# Patient Record
Sex: Female | Born: 1945 | Race: White | Hispanic: No | Marital: Married | Smoking: Never smoker
Health system: Southern US, Community
[De-identification: ages and names within clinical notes are randomized; demographics above are authoritative.]

## PROBLEM LIST (undated history)

## (undated) DIAGNOSIS — K219 Gastro-esophageal reflux disease without esophagitis: Secondary | ICD-10-CM

## (undated) DIAGNOSIS — Z9889 Other specified postprocedural states: Secondary | ICD-10-CM

## (undated) DIAGNOSIS — E119 Type 2 diabetes mellitus without complications: Secondary | ICD-10-CM

## (undated) DIAGNOSIS — E079 Disorder of thyroid, unspecified: Secondary | ICD-10-CM

## (undated) DIAGNOSIS — R112 Nausea with vomiting, unspecified: Secondary | ICD-10-CM

## (undated) DIAGNOSIS — E039 Hypothyroidism, unspecified: Secondary | ICD-10-CM

## (undated) DIAGNOSIS — K589 Irritable bowel syndrome without diarrhea: Secondary | ICD-10-CM

## (undated) HISTORY — DX: Gastro-esophageal reflux disease without esophagitis: K21.9

## (undated) HISTORY — PX: CHOLECYSTECTOMY: SHX55

## (undated) HISTORY — DX: Irritable bowel syndrome, unspecified: K58.9

## (undated) HISTORY — PX: MENISCUS REPAIR: SHX5179

## (undated) HISTORY — DX: Disorder of thyroid, unspecified: E07.9

## (undated) HISTORY — DX: Type 2 diabetes mellitus without complications: E11.9

---

## 2002-12-15 HISTORY — PX: BREAST BIOPSY: SHX20

## 2004-11-25 ENCOUNTER — Ambulatory Visit: Payer: Self-pay | Admitting: Family Medicine

## 2006-08-03 ENCOUNTER — Ambulatory Visit: Payer: Self-pay | Admitting: Family Medicine

## 2007-11-09 ENCOUNTER — Ambulatory Visit: Payer: Self-pay | Admitting: Family Medicine

## 2007-11-23 ENCOUNTER — Ambulatory Visit: Payer: Self-pay | Admitting: Family Medicine

## 2008-11-08 ENCOUNTER — Ambulatory Visit: Payer: Self-pay | Admitting: Internal Medicine

## 2009-01-01 ENCOUNTER — Ambulatory Visit: Payer: Self-pay | Admitting: Specialist

## 2009-01-11 ENCOUNTER — Ambulatory Visit: Payer: Self-pay | Admitting: Specialist

## 2009-05-29 ENCOUNTER — Ambulatory Visit: Payer: Self-pay | Admitting: Family Medicine

## 2009-05-30 ENCOUNTER — Ambulatory Visit: Payer: Self-pay | Admitting: Family Medicine

## 2010-09-02 ENCOUNTER — Ambulatory Visit: Payer: Self-pay | Admitting: Family Medicine

## 2010-09-05 ENCOUNTER — Ambulatory Visit: Payer: Self-pay | Admitting: Family Medicine

## 2011-03-14 ENCOUNTER — Ambulatory Visit: Payer: Self-pay | Admitting: Specialist

## 2011-08-05 ENCOUNTER — Ambulatory Visit: Payer: Self-pay | Admitting: Specialist

## 2011-08-29 ENCOUNTER — Ambulatory Visit: Payer: Self-pay | Admitting: Specialist

## 2011-12-19 DIAGNOSIS — Z23 Encounter for immunization: Secondary | ICD-10-CM | POA: Diagnosis not present

## 2012-10-11 DIAGNOSIS — H8309 Labyrinthitis, unspecified ear: Secondary | ICD-10-CM | POA: Diagnosis not present

## 2012-10-11 DIAGNOSIS — J329 Chronic sinusitis, unspecified: Secondary | ICD-10-CM | POA: Diagnosis not present

## 2012-10-27 DIAGNOSIS — Z0000 Encounter for general adult medical examination without abnormal findings: Secondary | ICD-10-CM | POA: Diagnosis not present

## 2012-10-27 DIAGNOSIS — Z01419 Encounter for gynecological examination (general) (routine) without abnormal findings: Secondary | ICD-10-CM | POA: Diagnosis not present

## 2012-10-27 DIAGNOSIS — H8309 Labyrinthitis, unspecified ear: Secondary | ICD-10-CM | POA: Diagnosis not present

## 2012-10-27 DIAGNOSIS — J329 Chronic sinusitis, unspecified: Secondary | ICD-10-CM | POA: Diagnosis not present

## 2012-11-05 ENCOUNTER — Ambulatory Visit: Payer: Self-pay | Admitting: Family Medicine

## 2012-11-05 DIAGNOSIS — Z1231 Encounter for screening mammogram for malignant neoplasm of breast: Secondary | ICD-10-CM | POA: Diagnosis not present

## 2013-01-10 DIAGNOSIS — Z23 Encounter for immunization: Secondary | ICD-10-CM | POA: Diagnosis not present

## 2013-12-15 HISTORY — PX: COLONOSCOPY: SHX174

## 2013-12-23 DIAGNOSIS — Z23 Encounter for immunization: Secondary | ICD-10-CM | POA: Diagnosis not present

## 2014-02-14 DIAGNOSIS — H9319 Tinnitus, unspecified ear: Secondary | ICD-10-CM | POA: Diagnosis not present

## 2014-02-14 DIAGNOSIS — H6120 Impacted cerumen, unspecified ear: Secondary | ICD-10-CM | POA: Diagnosis not present

## 2014-02-17 DIAGNOSIS — H903 Sensorineural hearing loss, bilateral: Secondary | ICD-10-CM | POA: Diagnosis not present

## 2014-02-17 DIAGNOSIS — R42 Dizziness and giddiness: Secondary | ICD-10-CM | POA: Diagnosis not present

## 2014-02-24 DIAGNOSIS — H903 Sensorineural hearing loss, bilateral: Secondary | ICD-10-CM | POA: Diagnosis not present

## 2014-02-24 DIAGNOSIS — R42 Dizziness and giddiness: Secondary | ICD-10-CM | POA: Diagnosis not present

## 2014-03-15 DIAGNOSIS — D1039 Benign neoplasm of other parts of mouth: Secondary | ICD-10-CM | POA: Diagnosis not present

## 2014-03-31 DIAGNOSIS — R634 Abnormal weight loss: Secondary | ICD-10-CM | POA: Diagnosis not present

## 2014-03-31 DIAGNOSIS — E119 Type 2 diabetes mellitus without complications: Secondary | ICD-10-CM | POA: Diagnosis not present

## 2014-03-31 DIAGNOSIS — R109 Unspecified abdominal pain: Secondary | ICD-10-CM | POA: Diagnosis not present

## 2014-04-04 DIAGNOSIS — E039 Hypothyroidism, unspecified: Secondary | ICD-10-CM | POA: Diagnosis not present

## 2014-04-04 DIAGNOSIS — E785 Hyperlipidemia, unspecified: Secondary | ICD-10-CM | POA: Diagnosis not present

## 2014-04-04 DIAGNOSIS — E119 Type 2 diabetes mellitus without complications: Secondary | ICD-10-CM | POA: Diagnosis not present

## 2014-04-10 DIAGNOSIS — Z8371 Family history of colonic polyps: Secondary | ICD-10-CM | POA: Diagnosis not present

## 2014-04-10 DIAGNOSIS — IMO0001 Reserved for inherently not codable concepts without codable children: Secondary | ICD-10-CM | POA: Diagnosis not present

## 2014-04-20 DIAGNOSIS — E119 Type 2 diabetes mellitus without complications: Secondary | ICD-10-CM | POA: Diagnosis not present

## 2014-04-25 ENCOUNTER — Ambulatory Visit: Payer: Self-pay | Admitting: Gastroenterology

## 2014-04-25 DIAGNOSIS — R5382 Chronic fatigue, unspecified: Secondary | ICD-10-CM | POA: Diagnosis not present

## 2014-04-25 DIAGNOSIS — G9332 Myalgic encephalomyelitis/chronic fatigue syndrome: Secondary | ICD-10-CM | POA: Diagnosis not present

## 2014-04-25 DIAGNOSIS — E119 Type 2 diabetes mellitus without complications: Secondary | ICD-10-CM | POA: Diagnosis not present

## 2014-04-25 DIAGNOSIS — Z833 Family history of diabetes mellitus: Secondary | ICD-10-CM | POA: Diagnosis not present

## 2014-04-25 DIAGNOSIS — Z79899 Other long term (current) drug therapy: Secondary | ICD-10-CM | POA: Diagnosis not present

## 2014-04-25 DIAGNOSIS — K589 Irritable bowel syndrome without diarrhea: Secondary | ICD-10-CM | POA: Diagnosis not present

## 2014-04-25 DIAGNOSIS — Z83719 Family history of colon polyps, unspecified: Secondary | ICD-10-CM | POA: Diagnosis not present

## 2014-04-25 DIAGNOSIS — Z801 Family history of malignant neoplasm of trachea, bronchus and lung: Secondary | ICD-10-CM | POA: Diagnosis not present

## 2014-04-25 DIAGNOSIS — Z8371 Family history of colonic polyps: Secondary | ICD-10-CM | POA: Diagnosis not present

## 2014-04-25 DIAGNOSIS — Z1211 Encounter for screening for malignant neoplasm of colon: Secondary | ICD-10-CM | POA: Diagnosis not present

## 2014-04-25 DIAGNOSIS — Z880 Allergy status to penicillin: Secondary | ICD-10-CM | POA: Diagnosis not present

## 2014-04-25 DIAGNOSIS — E785 Hyperlipidemia, unspecified: Secondary | ICD-10-CM | POA: Diagnosis not present

## 2014-04-25 DIAGNOSIS — R51 Headache: Secondary | ICD-10-CM | POA: Diagnosis not present

## 2014-04-25 LAB — HM COLONOSCOPY

## 2014-05-15 DIAGNOSIS — E039 Hypothyroidism, unspecified: Secondary | ICD-10-CM | POA: Diagnosis not present

## 2014-05-15 DIAGNOSIS — K589 Irritable bowel syndrome without diarrhea: Secondary | ICD-10-CM | POA: Diagnosis not present

## 2014-05-15 DIAGNOSIS — E119 Type 2 diabetes mellitus without complications: Secondary | ICD-10-CM | POA: Diagnosis not present

## 2014-09-05 DIAGNOSIS — H9319 Tinnitus, unspecified ear: Secondary | ICD-10-CM | POA: Diagnosis not present

## 2014-09-05 DIAGNOSIS — H6120 Impacted cerumen, unspecified ear: Secondary | ICD-10-CM | POA: Diagnosis not present

## 2014-09-08 DIAGNOSIS — N3090 Cystitis, unspecified without hematuria: Secondary | ICD-10-CM | POA: Diagnosis not present

## 2014-11-20 DIAGNOSIS — R51 Headache: Secondary | ICD-10-CM | POA: Diagnosis not present

## 2014-11-20 DIAGNOSIS — Z0000 Encounter for general adult medical examination without abnormal findings: Secondary | ICD-10-CM | POA: Diagnosis not present

## 2014-11-20 DIAGNOSIS — Z1389 Encounter for screening for other disorder: Secondary | ICD-10-CM | POA: Diagnosis not present

## 2014-11-20 DIAGNOSIS — E039 Hypothyroidism, unspecified: Secondary | ICD-10-CM | POA: Diagnosis not present

## 2014-11-20 DIAGNOSIS — E784 Other hyperlipidemia: Secondary | ICD-10-CM | POA: Diagnosis not present

## 2014-11-20 DIAGNOSIS — E119 Type 2 diabetes mellitus without complications: Secondary | ICD-10-CM | POA: Diagnosis not present

## 2014-11-20 DIAGNOSIS — Z23 Encounter for immunization: Secondary | ICD-10-CM | POA: Diagnosis not present

## 2014-11-23 DIAGNOSIS — R519 Headache, unspecified: Secondary | ICD-10-CM | POA: Insufficient documentation

## 2014-11-23 DIAGNOSIS — R42 Dizziness and giddiness: Secondary | ICD-10-CM | POA: Diagnosis not present

## 2014-11-23 DIAGNOSIS — R51 Headache: Secondary | ICD-10-CM | POA: Diagnosis not present

## 2014-11-23 DIAGNOSIS — E860 Dehydration: Secondary | ICD-10-CM | POA: Diagnosis not present

## 2014-11-29 DIAGNOSIS — L309 Dermatitis, unspecified: Secondary | ICD-10-CM | POA: Diagnosis not present

## 2015-01-02 ENCOUNTER — Ambulatory Visit: Payer: Self-pay | Admitting: Family Medicine

## 2015-01-02 DIAGNOSIS — Z780 Asymptomatic menopausal state: Secondary | ICD-10-CM | POA: Diagnosis not present

## 2015-01-02 DIAGNOSIS — Z1231 Encounter for screening mammogram for malignant neoplasm of breast: Secondary | ICD-10-CM | POA: Diagnosis not present

## 2015-01-02 DIAGNOSIS — Z13820 Encounter for screening for osteoporosis: Secondary | ICD-10-CM | POA: Diagnosis not present

## 2015-01-12 ENCOUNTER — Ambulatory Visit: Payer: Self-pay | Admitting: Family Medicine

## 2015-01-12 DIAGNOSIS — R928 Other abnormal and inconclusive findings on diagnostic imaging of breast: Secondary | ICD-10-CM | POA: Diagnosis not present

## 2015-01-12 DIAGNOSIS — R921 Mammographic calcification found on diagnostic imaging of breast: Secondary | ICD-10-CM | POA: Diagnosis not present

## 2015-05-07 DIAGNOSIS — E039 Hypothyroidism, unspecified: Secondary | ICD-10-CM | POA: Diagnosis not present

## 2015-05-07 DIAGNOSIS — E784 Other hyperlipidemia: Secondary | ICD-10-CM | POA: Diagnosis not present

## 2015-05-07 DIAGNOSIS — E119 Type 2 diabetes mellitus without complications: Secondary | ICD-10-CM | POA: Diagnosis not present

## 2015-08-21 ENCOUNTER — Other Ambulatory Visit: Payer: Self-pay

## 2015-08-21 DIAGNOSIS — R928 Other abnormal and inconclusive findings on diagnostic imaging of breast: Secondary | ICD-10-CM

## 2015-08-22 ENCOUNTER — Other Ambulatory Visit: Payer: Self-pay

## 2015-08-22 DIAGNOSIS — N6459 Other signs and symptoms in breast: Secondary | ICD-10-CM

## 2015-09-03 ENCOUNTER — Ambulatory Visit
Admission: RE | Admit: 2015-09-03 | Discharge: 2015-09-03 | Disposition: A | Discharge status: Home / Self Care | Payer: Medicare Other | Source: Ambulatory Visit | Attending: Family Medicine | Admitting: Family Medicine

## 2015-09-03 ENCOUNTER — Other Ambulatory Visit: Payer: Self-pay | Admitting: Family Medicine

## 2015-09-03 DIAGNOSIS — R921 Mammographic calcification found on diagnostic imaging of breast: Secondary | ICD-10-CM | POA: Insufficient documentation

## 2015-09-03 DIAGNOSIS — N6459 Other signs and symptoms in breast: Secondary | ICD-10-CM

## 2015-09-03 DIAGNOSIS — R928 Other abnormal and inconclusive findings on diagnostic imaging of breast: Secondary | ICD-10-CM

## 2015-09-24 DIAGNOSIS — J301 Allergic rhinitis due to pollen: Secondary | ICD-10-CM | POA: Diagnosis not present

## 2015-09-24 DIAGNOSIS — H6122 Impacted cerumen, left ear: Secondary | ICD-10-CM | POA: Diagnosis not present

## 2015-12-06 DIAGNOSIS — Z23 Encounter for immunization: Secondary | ICD-10-CM | POA: Diagnosis not present

## 2015-12-21 ENCOUNTER — Other Ambulatory Visit: Payer: Self-pay

## 2015-12-21 DIAGNOSIS — E119 Type 2 diabetes mellitus without complications: Secondary | ICD-10-CM

## 2015-12-21 DIAGNOSIS — E039 Hypothyroidism, unspecified: Secondary | ICD-10-CM

## 2015-12-21 MED ORDER — LEVOTHYROXINE SODIUM 25 MCG PO TABS: 25.0000 ug | ORAL_TABLET | Freq: Every day | ORAL | Status: DC

## 2015-12-21 MED ORDER — METFORMIN HCL 500 MG PO TABS: 500.0000 mg | ORAL_TABLET | Freq: Two times a day (BID) | ORAL | Status: DC

## 2016-01-15 ENCOUNTER — Ambulatory Visit (INDEPENDENT_AMBULATORY_CARE_PROVIDER_SITE_OTHER): Payer: Medicare Other | Admitting: Family Medicine

## 2016-01-15 ENCOUNTER — Encounter: Payer: Self-pay | Admitting: Family Medicine

## 2016-01-15 VITALS — BP 122/82 | HR 80 | Ht 66.0 in | Wt 157.0 lb

## 2016-01-15 DIAGNOSIS — E039 Hypothyroidism, unspecified: Secondary | ICD-10-CM | POA: Diagnosis not present

## 2016-01-15 DIAGNOSIS — R928 Other abnormal and inconclusive findings on diagnostic imaging of breast: Secondary | ICD-10-CM

## 2016-01-15 DIAGNOSIS — Z23 Encounter for immunization: Secondary | ICD-10-CM | POA: Diagnosis not present

## 2016-01-15 DIAGNOSIS — E119 Type 2 diabetes mellitus without complications: Secondary | ICD-10-CM | POA: Diagnosis not present

## 2016-01-15 DIAGNOSIS — K219 Gastro-esophageal reflux disease without esophagitis: Secondary | ICD-10-CM | POA: Diagnosis not present

## 2016-01-15 DIAGNOSIS — K589 Irritable bowel syndrome without diarrhea: Secondary | ICD-10-CM

## 2016-01-15 MED ORDER — CHOLESTYRAMINE LIGHT 4 GM/DOSE PO POWD: 4.0000 g | Freq: Every day | ORAL | Status: DC

## 2016-01-15 MED ORDER — METFORMIN HCL 500 MG PO TABS: 500.0000 mg | ORAL_TABLET | Freq: Every day | ORAL | Status: DC

## 2016-01-15 MED ORDER — LEVOTHYROXINE SODIUM 25 MCG PO TABS: 25.0000 ug | ORAL_TABLET | Freq: Every day | ORAL | Status: DC

## 2016-01-15 NOTE — Progress Notes (Signed)
Name: Rebecca Robbins   MRN: [id number]    DOB: 07-Mar-1946   Date:01/15/2016       Progress Note  Subjective  Chief Complaint  Chief Complaint  Patient presents with  . Gastroesophageal Reflux  . Diabetes  . Irritable Bowel Syndrome    needs cholestyramine refilled  . Immunizations    needs Prevnar 13  . abnormal mammogram    follow up on R) breast- bilateral diagnostic with add views on R) breast    Gastroesophageal Reflux She reports no abdominal pain, no belching, no chest pain, no choking, no coughing, no dysphagia, no early satiety, no globus sensation, no heartburn, no hoarse voice, no nausea, no sore throat, no stridor, no tooth decay, no water brash or no wheezing. The current episode started more than 1 year ago. The problem occurs occasionally. The problem has been gradually improving. The symptoms are aggravated by certain foods. Pertinent negatives include no anemia, fatigue, melena, muscle weakness, orthopnea or weight loss. There are no known risk factors. She has tried a PPI for the symptoms. The treatment provided moderate relief.  Diabetes She presents for her follow-up diabetic visit. She has type 2 diabetes mellitus. Pertinent negatives for hypoglycemia include no dizziness, headaches, nervousness/anxiousness or tremors. Pertinent negatives for diabetes include no blurred vision, no chest pain, no fatigue, no foot paresthesias, no foot ulcerations, no polydipsia, no polyphagia, no polyuria, no visual change, no weakness and no weight loss. (Diarrhea) Risk factors for coronary artery disease include diabetes mellitus and dyslipidemia. Current diabetic treatment includes oral agent (monotherapy). She participates in exercise intermittently. Her breakfast blood glucose is taken between 8-9 am. Her breakfast blood glucose range is generally 110-130 mg/dl. An ACE inhibitor/angiotensin II receptor blocker is being taken. She does not see a podiatrist.Eye exam is not current.   Thyroid Problem Presents for follow-up visit. Patient reports no anxiety, cold intolerance, constipation, depressed mood, diaphoresis, diarrhea, dry skin, fatigue, hair loss, heat intolerance, hoarse voice, leg swelling, menstrual problem, nail problem, palpitations, tremors, visual change, weight gain or weight loss. The symptoms have been stable. Past treatments include levothyroxine. The treatment provided moderate relief. There are no known risk factors.  Abdominal Cramping This is a chronic problem. The current episode started more than 1 year ago. The problem has been gradually improving. The pain is located in the generalized abdominal region. The pain is mild. The quality of the pain is colicky. Pertinent negatives include no belching, constipation, diarrhea, dysuria, fever, frequency, headaches, hematuria, melena, myalgias, nausea or weight loss. Her past medical history is significant for GERD.    No problem-specific assessment & plan notes found for this encounter.   Past Medical History  Diagnosis Date  . Thyroid disease   . GERD (gastroesophageal reflux disease)   . Diabetes mellitus without complication ([place])   . Irritable bowel syndrome     Past Surgical History  Procedure Laterality Date  . Breast biopsy Right 2004    neg  . Cholecystectomy    . Colonoscopy  2015    repeat 10 yrs- Dr Allen Norris    Family History  Problem Relation Age of Onset  . Cancer Mother   . Diabetes Mother   . Cancer Father   . Heart disease Maternal Grandmother   . Heart disease Paternal Grandmother     Social History   Social History  . Marital Status: Married    Spouse Name: N/A  . Number of Children: N/A  . Years of Education: N/A  Occupational History  . Not on file.   Social History Main Topics  . Smoking status: Never Smoker   . Smokeless tobacco: Not on file  . Alcohol Use: 0.0 oz/week    0 Standard drinks or equivalent per week  . Drug Use: No  . Sexual Activity: Yes    Other Topics Concern  . Not on file   Social History Narrative    Allergies  Allergen Reactions  . Penicillins Other (See Comments)    Does not work for patient  . Hydrocodone-Acetaminophen Nausea Only and Rash     Review of Systems  Constitutional: Negative for fever, chills, weight loss, weight gain, malaise/fatigue, diaphoresis and fatigue.  HENT: Negative for ear discharge, ear pain, hoarse voice and sore throat.   Eyes: Negative for blurred vision.  Respiratory: Negative for cough, sputum production, choking, shortness of breath and wheezing.   Cardiovascular: Negative for chest pain, palpitations and leg swelling.  Gastrointestinal: Negative for heartburn, dysphagia, nausea, abdominal pain, diarrhea, constipation, blood in stool and melena.  Genitourinary: Negative for dysuria, urgency, frequency, hematuria and menstrual problem.  Musculoskeletal: Negative for myalgias, back pain, joint pain, muscle weakness and neck pain.  Skin: Negative for rash.  Neurological: Negative for dizziness, tingling, tremors, sensory change, focal weakness, weakness and headaches.  Endo/Heme/Allergies: Negative for environmental allergies, cold intolerance, heat intolerance, polydipsia and polyphagia. Does not bruise/bleed easily.  Psychiatric/Behavioral: Negative for depression and suicidal ideas. The patient is not nervous/anxious and does not have insomnia.      Objective  Filed Vitals:   01/15/16 0915  BP: 122/82  Pulse: 80  Height: 5\' 6"  (1.676 m)  Weight: 157 lb (71.215 kg)    Physical Exam  Constitutional: She is well-developed, well-nourished, and in no distress. No distress.  HENT:  Head: Normocephalic and atraumatic.  Right Ear: External ear normal.  Left Ear: External ear normal.  Nose: Nose normal.  Mouth/Throat: Oropharynx is clear and moist.  Eyes: Conjunctivae and EOM are normal. Pupils are equal, round, and reactive to light. Right eye exhibits no discharge. Left  eye exhibits no discharge.  Neck: Normal range of motion. Neck supple. No JVD present. No thyromegaly present.  Cardiovascular: Normal rate, regular rhythm, normal heart sounds and intact distal pulses.  Exam reveals no gallop and no friction rub.   No murmur heard. Pulmonary/Chest: Effort normal and breath sounds normal.  Abdominal: Soft. Bowel sounds are normal. She exhibits no mass. There is no tenderness. There is no guarding.  Musculoskeletal: Normal range of motion. She exhibits no edema.  Lymphadenopathy:    She has no cervical adenopathy.  Neurological: She is alert. She has normal reflexes.  Skin: Skin is warm and dry. She is not diaphoretic.  Psychiatric: Mood and affect normal.  Nursing note and vitals reviewed.     Assessment & Plan  Problem List Items Addressed This Visit    None    Visit Diagnoses    Type 2 diabetes mellitus without complication, without long-term current use of insulin (HCC)    -  Primary    IBS (irritable bowel syndrome)        Relevant Medications    omeprazole (PRILOSEC) 20 MG capsule    cholestyramine light (PREVALITE) 4 GM/DOSE powder    Hypothyroidism, unspecified hypothyroidism type        Relevant Medications    levothyroxine (SYNTHROID, LEVOTHROID) 25 MCG tablet    Abnormal mammogram        Relevant Orders  MM Digital Diagnostic Bilat    US BREAST LTD UNI RIGHT INC AXILLA    Gastroesophageal reflux disease, esophagitis presence not specified        Relevant Medications    omeprazole (PRILOSEC) 20 MG capsule    Need for pneumococcal vaccination        Relevant Orders    Pneumococcal conjugate vaccine 13-valent (Completed)         Dr. [facility]  01/15/2016

## 2016-01-15 NOTE — Addendum Note (Signed)
Addended by: Fredderick Severance on: 01/15/2016 01:46 PM   Modules accepted: Orders

## 2016-01-29 ENCOUNTER — Other Ambulatory Visit: Payer: Self-pay | Admitting: Family Medicine

## 2016-01-29 ENCOUNTER — Ambulatory Visit
Admission: RE | Admit: 2016-01-29 | Discharge: 2016-01-29 | Disposition: A | Discharge status: Home / Self Care | Payer: Medicare Other | Source: Ambulatory Visit | Attending: Family Medicine | Admitting: Family Medicine

## 2016-01-29 DIAGNOSIS — R921 Mammographic calcification found on diagnostic imaging of breast: Secondary | ICD-10-CM | POA: Diagnosis not present

## 2016-01-29 DIAGNOSIS — R928 Other abnormal and inconclusive findings on diagnostic imaging of breast: Secondary | ICD-10-CM | POA: Diagnosis not present

## 2016-02-26 ENCOUNTER — Other Ambulatory Visit: Payer: Self-pay | Admitting: Family Medicine

## 2016-03-21 ENCOUNTER — Other Ambulatory Visit: Payer: Medicare Other

## 2016-03-21 ENCOUNTER — Other Ambulatory Visit: Payer: Self-pay

## 2016-03-21 DIAGNOSIS — E039 Hypothyroidism, unspecified: Secondary | ICD-10-CM | POA: Diagnosis not present

## 2016-03-21 DIAGNOSIS — E119 Type 2 diabetes mellitus without complications: Secondary | ICD-10-CM | POA: Diagnosis not present

## 2016-03-21 DIAGNOSIS — I10 Essential (primary) hypertension: Secondary | ICD-10-CM

## 2016-03-22 LAB — RENAL FUNCTION PANEL
Albumin: 5 g/dL — ABNORMAL HIGH (ref 3.5–4.8)
BUN/Creatinine Ratio: 18 (ref 12–28)
BUN: 13 mg/dL (ref 8–27)
CALCIUM: 10.1 mg/dL (ref 8.7–10.3)
CO2: 23 mmol/L (ref 18–29)
CREATININE: 0.71 mg/dL (ref 0.57–1.00)
Chloride: 99 mmol/L (ref 96–106)
GFR calc Af Amer: 100 mL/min/{1.73_m2} (ref >59)
GFR, EST NON AFRICAN AMERICAN: 87 mL/min/{1.73_m2} (ref >59)
Glucose: 127 mg/dL — ABNORMAL HIGH (ref 65–99)
PHOSPHORUS: 3.9 mg/dL (ref 2.5–4.5)
Potassium: 4.9 mmol/L (ref 3.5–5.2)
SODIUM: 142 mmol/L (ref 134–144)

## 2016-03-22 LAB — LIPID PANEL
CHOL/HDL RATIO: 3.2 ratio (ref 0.0–4.4)
CHOLESTEROL TOTAL: 230 mg/dL — AB (ref 100–199)
HDL: 71 mg/dL (ref >39)
LDL Calculated: 134 mg/dL — ABNORMAL HIGH (ref 0–99)
TRIGLYCERIDES: 124 mg/dL (ref 0–149)
VLDL CHOLESTEROL CAL: 25 mg/dL (ref 5–40)

## 2016-03-22 LAB — HEMOGLOBIN A1C
Est. average glucose Bld gHb Est-mCnc: 151 mg/dL
HEMOGLOBIN A1C: 6.9 % — AB (ref 4.8–5.6)

## 2016-03-22 LAB — TSH: TSH: 3.97 u[IU]/mL (ref 0.450–4.500)

## 2016-03-25 ENCOUNTER — Other Ambulatory Visit: Payer: Self-pay

## 2016-03-25 MED ORDER — METFORMIN HCL 500 MG PO TABS: 500.0000 mg | ORAL_TABLET | Freq: Every day | ORAL | Status: DC

## 2016-05-23 ENCOUNTER — Ambulatory Visit (INDEPENDENT_AMBULATORY_CARE_PROVIDER_SITE_OTHER): Payer: Medicare Other | Admitting: Family Medicine

## 2016-05-23 ENCOUNTER — Encounter: Payer: Self-pay | Admitting: Family Medicine

## 2016-05-23 VITALS — BP 120/80 | HR 80 | Ht 66.0 in | Wt 153.0 lb

## 2016-05-23 DIAGNOSIS — J0110 Acute frontal sinusitis, unspecified: Secondary | ICD-10-CM | POA: Diagnosis not present

## 2016-05-23 DIAGNOSIS — J40 Bronchitis, not specified as acute or chronic: Secondary | ICD-10-CM

## 2016-05-23 DIAGNOSIS — N3090 Cystitis, unspecified without hematuria: Secondary | ICD-10-CM

## 2016-05-23 LAB — POCT URINALYSIS DIPSTICK
Bilirubin, UA: NEGATIVE
Blood, UA: NEGATIVE
GLUCOSE UA: NEGATIVE
Ketones, UA: NEGATIVE
LEUKOCYTES UA: NEGATIVE
Nitrite, UA: NEGATIVE
PROTEIN UA: NEGATIVE
Spec Grav, UA: 1.01
UROBILINOGEN UA: 0.2
pH, UA: 5

## 2016-05-23 MED ORDER — MONTELUKAST SODIUM 10 MG PO TABS: 10.0000 mg | ORAL_TABLET | Freq: Every day | ORAL | Status: DC

## 2016-05-23 MED ORDER — SULFAMETHOXAZOLE-TRIMETHOPRIM 800-160 MG PO TABS: 1.0000 | ORAL_TABLET | Freq: Two times a day (BID) | ORAL | Status: DC

## 2016-05-23 NOTE — Progress Notes (Addendum)
Name: Rebecca Robbins   MRN: [id number]    DOB: [birth year]   Date:05/23/2016       Progress Note  Subjective  Chief Complaint  Chief Complaint  Patient presents with  . Sinusitis    x 3 weeks- had a ZPack and "got better", but still congested and has a headache  . Urinary Tract Infection    frequent urination and burning    Sinusitis This is a new problem. The current episode started 1 to 4 weeks ago. The problem has been waxing and waning since onset. The maximum temperature recorded prior to her arrival was 100.4 - 100.9 F. The fever has been present for 1 to 2 days. Associated symptoms include chills, congestion, coughing, headaches, shortness of breath, sinus pressure, sneezing, a sore throat and swollen glands. Pertinent negatives include no diaphoresis, ear pain, hoarse voice or neck pain. Past treatments include acetaminophen. The treatment provided no relief.  Urinary Tract Infection  Associated symptoms include chills. Pertinent negatives include no frequency, hematuria, nausea or urgency.    No problem-specific assessment & plan notes found for this encounter.   Past Medical History  Diagnosis Date  . Thyroid disease   . GERD (gastroesophageal reflux disease)   . Diabetes mellitus without complication ([place])   . Irritable bowel syndrome     Past Surgical History  Procedure Laterality Date  . Cholecystectomy    . Colonoscopy  2015    repeat 10 yrs- Dr Allen Norris  . Breast biopsy Right 2004    neg    Family History  Problem Relation Age of Onset  . Cancer Mother   . Diabetes Mother   . Cancer Father   . Heart disease Maternal Grandmother   . Heart disease Paternal Grandmother   . Breast cancer Neg Hx     Social History   Social History  . Marital Status: Married    Spouse Name: N/A  . Number of Children: N/A  . Years of Education: N/A   Occupational History  . Not on file.   Social History Main Topics  . Smoking status: Never Smoker   . Smokeless  tobacco: Not on file  . Alcohol Use: 0.0 oz/week    0 Standard drinks or equivalent per week  . Drug Use: No  . Sexual Activity: Yes   Other Topics Concern  . Not on file   Social History Narrative    Allergies  Allergen Reactions  . Penicillins Other (See Comments)    Does not work for patient  . Hydrocodone-Acetaminophen Nausea Only and Rash     Review of Systems  Constitutional: Positive for chills. Negative for fever, weight loss, malaise/fatigue and diaphoresis.  HENT: Positive for congestion, sinus pressure, sneezing and sore throat. Negative for ear discharge, ear pain and hoarse voice.   Eyes: Negative for blurred vision.  Respiratory: Positive for cough and shortness of breath. Negative for sputum production and wheezing.   Cardiovascular: Negative for chest pain, palpitations and leg swelling.  Gastrointestinal: Negative for heartburn, nausea, abdominal pain, diarrhea, constipation, blood in stool and melena.  Genitourinary: Negative for dysuria, urgency, frequency and hematuria.  Musculoskeletal: Negative for myalgias, back pain, joint pain and neck pain.  Skin: Negative for rash.  Neurological: Positive for headaches. Negative for dizziness, tingling, sensory change and focal weakness.  Endo/Heme/Allergies: Negative for environmental allergies and polydipsia. Does not bruise/bleed easily.  Psychiatric/Behavioral: Negative for depression and suicidal ideas. The patient is not nervous/anxious and does not have insomnia.  Objective  Filed Vitals:   05/23/16 1404  BP: 120/80  Pulse: 80  Height: 5\' 6"  (1.676 m)  Weight: 153 lb (69.4 kg)    Physical Exam  Constitutional: She is well-developed, well-nourished, and in no distress. No distress.  HENT:  Head: Normocephalic and atraumatic.  Right Ear: Tympanic membrane, external ear and ear canal normal.  Left Ear: Tympanic membrane, external ear and ear canal normal.  Nose: Nose normal.  Mouth/Throat:  Oropharynx is clear and moist.  Eyes: Conjunctivae and EOM are normal. Pupils are equal, round, and reactive to light. Right eye exhibits no discharge. Left eye exhibits no discharge.  Neck: Normal range of motion. Neck supple. No JVD present. No thyromegaly present.  Cardiovascular: Normal rate, regular rhythm, normal heart sounds and intact distal pulses.  Exam reveals no gallop and no friction rub.   No murmur heard. Pulmonary/Chest: Effort normal and breath sounds normal.  Abdominal: Soft. Bowel sounds are normal. She exhibits no mass. There is no tenderness. There is no guarding.  Musculoskeletal: Normal range of motion. She exhibits no edema.  Lymphadenopathy:    She has no cervical adenopathy.  Neurological: She is alert.  Skin: Skin is warm and dry. She is not diaphoretic.  Psychiatric: Mood and affect normal.  Nursing note and vitals reviewed.     Assessment & Plan  Problem List Items Addressed This Visit    None    Visit Diagnoses    Acute frontal sinusitis, recurrence not specified    -  Primary    Relevant Medications    montelukast (SINGULAIR) 10 MG tablet    sulfamethoxazole-trimethoprim (BACTRIM DS,SEPTRA DS) 800-160 MG tablet    Bronchitis        Relevant Medications    montelukast (SINGULAIR) 10 MG tablet    sulfamethoxazole-trimethoprim (BACTRIM DS,SEPTRA DS) 800-160 MG tablet         Dr. [facility]  05/23/2016

## 2016-05-23 NOTE — Addendum Note (Signed)
Addended by: Juline Patch on: 05/23/2016 02:25 PM   Modules accepted: Miquel Dunn

## 2016-05-23 NOTE — Addendum Note (Signed)
Addended by: Fredderick Severance on: 05/23/2016 03:10 PM   Modules accepted: Orders

## 2016-07-11 ENCOUNTER — Other Ambulatory Visit: Payer: Self-pay

## 2016-07-11 DIAGNOSIS — K589 Irritable bowel syndrome without diarrhea: Secondary | ICD-10-CM

## 2016-07-11 MED ORDER — CHOLESTYRAMINE LIGHT 4 GM/DOSE PO POWD: 4.0000 g | Freq: Every day | ORAL | 0 refills | Status: DC

## 2016-08-15 ENCOUNTER — Other Ambulatory Visit: Payer: Self-pay

## 2016-08-15 DIAGNOSIS — E039 Hypothyroidism, unspecified: Secondary | ICD-10-CM

## 2016-08-15 MED ORDER — LEVOTHYROXINE SODIUM 25 MCG PO TABS: 25.0000 ug | ORAL_TABLET | Freq: Every day | ORAL | 0 refills | Status: DC

## 2016-08-28 ENCOUNTER — Other Ambulatory Visit: Payer: Self-pay | Admitting: Family Medicine

## 2016-08-28 ENCOUNTER — Ambulatory Visit (INDEPENDENT_AMBULATORY_CARE_PROVIDER_SITE_OTHER): Payer: Medicare Other | Admitting: Family Medicine

## 2016-08-28 ENCOUNTER — Encounter: Payer: Self-pay | Admitting: Family Medicine

## 2016-08-28 VITALS — BP 130/80 | HR 64 | Ht 66.0 in | Wt 156.0 lb

## 2016-08-28 DIAGNOSIS — J301 Allergic rhinitis due to pollen: Secondary | ICD-10-CM | POA: Diagnosis not present

## 2016-08-28 DIAGNOSIS — Z23 Encounter for immunization: Secondary | ICD-10-CM | POA: Diagnosis not present

## 2016-08-28 DIAGNOSIS — E785 Hyperlipidemia, unspecified: Secondary | ICD-10-CM

## 2016-08-28 DIAGNOSIS — E039 Hypothyroidism, unspecified: Secondary | ICD-10-CM | POA: Diagnosis not present

## 2016-08-28 DIAGNOSIS — M170 Bilateral primary osteoarthritis of knee: Secondary | ICD-10-CM | POA: Diagnosis not present

## 2016-08-28 DIAGNOSIS — K589 Irritable bowel syndrome without diarrhea: Secondary | ICD-10-CM | POA: Diagnosis not present

## 2016-08-28 DIAGNOSIS — K219 Gastro-esophageal reflux disease without esophagitis: Secondary | ICD-10-CM

## 2016-08-28 DIAGNOSIS — E119 Type 2 diabetes mellitus without complications: Secondary | ICD-10-CM | POA: Diagnosis not present

## 2016-08-28 MED ORDER — CHOLESTYRAMINE LIGHT 4 GM/DOSE PO POWD: 4.0000 g | Freq: Every day | ORAL | 0 refills | Status: DC

## 2016-08-28 MED ORDER — ETODOLAC ER 400 MG PO TB24: 400.0000 mg | ORAL_TABLET | Freq: Every day | ORAL | 1 refills | Status: DC

## 2016-08-28 MED ORDER — METFORMIN HCL 500 MG PO TABS: 500.0000 mg | ORAL_TABLET | Freq: Every day | ORAL | 1 refills | Status: DC

## 2016-08-28 MED ORDER — OMEPRAZOLE 20 MG PO CPDR: 20.0000 mg | DELAYED_RELEASE_CAPSULE | ORAL | 6 refills | Status: DC | PRN

## 2016-08-28 MED ORDER — LEVOTHYROXINE SODIUM 25 MCG PO TABS: 25.0000 ug | ORAL_TABLET | Freq: Every day | ORAL | 1 refills | Status: DC

## 2016-08-28 NOTE — Progress Notes (Signed)
Name: Rebecca Robbins   MRN: [id number]    DOB: [birth year]   Date:08/28/2016       Progress Note  Subjective  Chief Complaint  Chief Complaint  Patient presents with  . Hypothyroidism  . Gastroesophageal Reflux  . Diabetes  . irrirable bowel syndrome    Gastroesophageal Reflux  She reports no abdominal pain, no belching, no chest pain, no choking, no coughing, no dysphagia, no early satiety, no globus sensation, no heartburn, no hoarse voice, no nausea, no sore throat, no stridor, no tooth decay, no water brash or no wheezing. This is a chronic problem. The current episode started more than 1 year ago. The problem occurs rarely. The problem has been gradually improving. The symptoms are aggravated by certain foods. Pertinent negatives include no anemia, fatigue, melena, muscle weakness, orthopnea or weight loss. There are no known risk factors. She has tried a PPI for the symptoms. The treatment provided moderate relief.  Diabetes  She presents for her follow-up diabetic visit. She has type 2 diabetes mellitus. Her disease course has been stable. Pertinent negatives for hypoglycemia include no confusion, dizziness, headaches, hunger, mood changes, nervousness/anxiousness, pallor, seizures, sleepiness, speech difficulty, sweats or tremors. Pertinent negatives for diabetes include no blurred vision, no chest pain, no fatigue, no foot paresthesias, no foot ulcerations, no polydipsia, no polyphagia, no polyuria, no visual change, no weakness and no weight loss. There are no hypoglycemic complications. Symptoms are stable. There are no diabetic complications. There are no known risk factors for coronary artery disease. Current diabetic treatment includes oral agent (monotherapy) and diet. She is compliant with treatment most of the time. Her weight is stable. She is following a generally healthy diet. She participates in exercise daily. Her breakfast blood glucose is taken between 8-9 am. Her  breakfast blood glucose range is generally 130-140 mg/dl. An ACE inhibitor/angiotensin II receptor blocker is being taken. She does not see a podiatrist.Eye exam is not current.  Thyroid Problem  Presents for follow-up visit. Patient reports no anxiety, cold intolerance, constipation, depressed mood, diaphoresis, diarrhea, dry skin, fatigue, hair loss, heat intolerance, hoarse voice, leg swelling, menstrual problem, nail problem, palpitations, tremors, visual change, weight gain or weight loss. The symptoms have been stable.  Knee Pain   The incident occurred more than 1 week ago. The pain is present in the right knee and left knee. The quality of the pain is described as aching. The pain is moderate. Pertinent negatives include no tingling. Nothing aggravates the symptoms.    No problem-specific Assessment & Plan notes found for this encounter.   Past Medical History:  Diagnosis Date  . Diabetes mellitus without complication ([place])   . GERD (gastroesophageal reflux disease)   . Irritable bowel syndrome   . Thyroid disease     Past Surgical History:  Procedure Laterality Date  . BREAST BIOPSY Right 2004   neg  . CHOLECYSTECTOMY    . COLONOSCOPY  2015   repeat 10 yrs- Dr Allen Norris    Family History  Problem Relation Age of Onset  . Cancer Mother   . Diabetes Mother   . Cancer Father   . Heart disease Maternal Grandmother   . Heart disease Paternal Grandmother   . Breast cancer Neg Hx     Social History   Social History  . Marital status: Married    Spouse name: N/A  . Number of children: N/A  . Years of education: N/A   Occupational History  . Not on file.  Social History Main Topics  . Smoking status: Never Smoker  . Smokeless tobacco: Not on file  . Alcohol use 0.0 oz/week  . Drug use: No  . Sexual activity: Yes   Other Topics Concern  . Not on file   Social History Narrative  . No narrative on file    Allergies  Allergen Reactions  . Penicillins Other (See  Comments)    Does not work for patient  . Hydrocodone-Acetaminophen Nausea Only and Rash     Review of Systems  Constitutional: Negative for chills, diaphoresis, fatigue, fever, malaise/fatigue, weight gain and weight loss.  HENT: Negative for ear discharge, ear pain, hoarse voice and sore throat.   Eyes: Negative for blurred vision.  Respiratory: Negative for cough, sputum production, choking, shortness of breath and wheezing.   Cardiovascular: Negative for chest pain, palpitations and leg swelling.  Gastrointestinal: Negative for abdominal pain, blood in stool, constipation, diarrhea, dysphagia, heartburn, melena and nausea.  Genitourinary: Negative for dysuria, frequency, hematuria, menstrual problem and urgency.  Musculoskeletal: Negative for back pain, joint pain, myalgias, muscle weakness and neck pain.  Skin: Negative for pallor and rash.  Neurological: Negative for dizziness, tingling, tremors, sensory change, focal weakness, seizures, speech difficulty, weakness and headaches.  Endo/Heme/Allergies: Negative for environmental allergies, cold intolerance, heat intolerance, polydipsia and polyphagia. Does not bruise/bleed easily.  Psychiatric/Behavioral: Negative for confusion, depression and suicidal ideas. The patient is not nervous/anxious and does not have insomnia.      Objective  Vitals:   08/28/16 1027  BP: 130/80  Pulse: 64  Weight: 156 lb (70.8 kg)  Height: 5\' 6"  (1.676 m)    Physical Exam  Constitutional: She is well-developed, well-nourished, and in no distress. No distress.  HENT:  Head: Normocephalic and atraumatic.  Right Ear: External ear normal.  Left Ear: External ear normal.  Nose: Nose normal.  Mouth/Throat: Oropharynx is clear and moist.  Eyes: Conjunctivae and EOM are normal. Pupils are equal, round, and reactive to light. Right eye exhibits no discharge. Left eye exhibits no discharge.  Neck: Normal range of motion. Neck supple. No JVD present. No  thyromegaly present.  Cardiovascular: Normal rate, regular rhythm, normal heart sounds and intact distal pulses.  Exam reveals no gallop and no friction rub.   No murmur heard. Pulmonary/Chest: Effort normal and breath sounds normal.  Abdominal: Soft. Bowel sounds are normal. She exhibits no mass. There is no tenderness. There is no guarding.  Musculoskeletal: Normal range of motion. She exhibits no edema.  Lymphadenopathy:    She has no cervical adenopathy.  Neurological: She is alert.  Skin: Skin is warm and dry. She is not diaphoretic.  Psychiatric: Mood and affect normal.  Nursing note and vitals reviewed.     Assessment & Plan  Problem List Items Addressed This Visit      Respiratory   Allergic rhinitis due to pollen     Digestive   Esophageal reflux   Relevant Medications   omeprazole (PRILOSEC) 20 MG capsule     Endocrine   Type 2 diabetes mellitus without complication, without long-term current use of insulin (HCC) - Primary   Relevant Medications   metFORMIN (GLUCOPHAGE) 500 MG tablet   Other Relevant Orders   Renal Function Panel   Hemoglobin A1c   Thyroid activity decreased   Relevant Medications   levothyroxine (SYNTHROID, LEVOTHROID) 25 MCG tablet    Other Visit Diagnoses    IBS (irritable bowel syndrome)       Relevant Medications  cholestyramine light (PREVALITE) 4 GM/DOSE powder   omeprazole (PRILOSEC) 20 MG capsule   Hyperlipidemia       Relevant Medications   cholestyramine light (PREVALITE) 4 GM/DOSE powder   Other Relevant Orders   Lipid Profile   Osteoarthritis of both knees, unspecified osteoarthritis type       Relevant Medications   etodolac (LODINE XL) 400 MG 24 hr tablet   Flu vaccine need       Relevant Orders   Flu Vaccine QUAD 36+ mos PF IM (Fluarix & Fluzone Quad PF) (Completed)        Dr. [facility]  08/28/16

## 2016-08-29 ENCOUNTER — Other Ambulatory Visit: Payer: Self-pay

## 2016-08-29 LAB — RENAL FUNCTION PANEL
ALBUMIN: 4.9 g/dL — AB (ref 3.5–4.8)
BUN/Creatinine Ratio: 20 (ref 12–28)
BUN: 11 mg/dL (ref 8–27)
CO2: 24 mmol/L (ref 18–29)
CREATININE: 0.54 mg/dL — AB (ref 0.57–1.00)
Calcium: 10 mg/dL (ref 8.7–10.3)
Chloride: 100 mmol/L (ref 96–106)
GFR calc Af Amer: 110 mL/min/{1.73_m2} (ref >59)
GFR, EST NON AFRICAN AMERICAN: 95 mL/min/{1.73_m2} (ref >59)
Glucose: 129 mg/dL — ABNORMAL HIGH (ref 65–99)
PHOSPHORUS: 3.4 mg/dL (ref 2.5–4.5)
Potassium: 4.8 mmol/L (ref 3.5–5.2)
Sodium: 141 mmol/L (ref 134–144)

## 2016-08-29 LAB — LIPID PANEL
CHOL/HDL RATIO: 3.1 ratio (ref 0.0–4.4)
Cholesterol, Total: 227 mg/dL — ABNORMAL HIGH (ref 100–199)
HDL: 74 mg/dL (ref >39)
LDL CALC: 126 mg/dL — AB (ref 0–99)
TRIGLYCERIDES: 137 mg/dL (ref 0–149)
VLDL CHOLESTEROL CAL: 27 mg/dL (ref 5–40)

## 2016-08-29 LAB — HEMOGLOBIN A1C
Est. average glucose Bld gHb Est-mCnc: 166 mg/dL
Hgb A1c MFr Bld: 7.4 % — ABNORMAL HIGH (ref 4.8–5.6)

## 2016-08-29 MED ORDER — GLIPIZIDE ER 2.5 MG PO TB24: 2.5000 mg | ORAL_TABLET | Freq: Every day | ORAL | 1 refills | Status: DC

## 2016-10-20 ENCOUNTER — Other Ambulatory Visit: Payer: Self-pay

## 2016-10-20 ENCOUNTER — Telehealth: Payer: Self-pay

## 2016-10-20 DIAGNOSIS — H6123 Impacted cerumen, bilateral: Secondary | ICD-10-CM | POA: Diagnosis not present

## 2016-10-20 DIAGNOSIS — J069 Acute upper respiratory infection, unspecified: Secondary | ICD-10-CM | POA: Diagnosis not present

## 2016-10-20 DIAGNOSIS — H902 Conductive hearing loss, unspecified: Secondary | ICD-10-CM | POA: Diagnosis not present

## 2016-10-20 MED ORDER — SULFAMETHOXAZOLE-TRIMETHOPRIM 800-160 MG PO TABS: 1.0000 | ORAL_TABLET | Freq: Two times a day (BID) | ORAL | 0 refills | Status: DC

## 2016-10-20 NOTE — Telephone Encounter (Signed)
Pt called and has a UTI- wanted another round of Septra called in/ has mom in hospital will come in later this week for follow up. I sent in Septra/ pt to sched appt

## 2016-10-24 ENCOUNTER — Ambulatory Visit (INDEPENDENT_AMBULATORY_CARE_PROVIDER_SITE_OTHER): Payer: Medicare Other | Admitting: Family Medicine

## 2016-10-24 ENCOUNTER — Encounter: Payer: Self-pay | Admitting: Family Medicine

## 2016-10-24 VITALS — BP 140/80 | HR 88 | Ht 66.0 in | Wt 159.0 lb

## 2016-10-24 DIAGNOSIS — N3090 Cystitis, unspecified without hematuria: Secondary | ICD-10-CM

## 2016-10-24 LAB — POCT URINALYSIS DIPSTICK
Bilirubin, UA: NEGATIVE
Glucose, UA: NEGATIVE
KETONES UA: NEGATIVE
Leukocytes, UA: NEGATIVE
Nitrite, UA: NEGATIVE
PH UA: 5
PROTEIN UA: NEGATIVE
RBC UA: NEGATIVE
SPEC GRAV UA: 1.015
Urobilinogen, UA: 0.2

## 2016-10-24 MED ORDER — SULFAMETHOXAZOLE-TRIMETHOPRIM 800-160 MG PO TABS
1.0000 | ORAL_TABLET | Freq: Two times a day (BID) | ORAL | 0 refills | Status: DC
Start: 2016-10-24 — End: 2017-01-30

## 2016-10-24 NOTE — Progress Notes (Signed)
Name: Rebecca Robbins   MRN: [id number]    DOB: Jan 12, 1946   Date:10/24/2016       Progress Note  Subjective  Chief Complaint  Chief Complaint  Patient presents with  . Follow-up    started Septra DS for UTI 4 days ago- came in for urine check/ feeling better    Urinary Frequency   This is a new problem. The current episode started in the past 7 days. The problem has been gradually improving (on antibiotics). The pain is mild. The maximum temperature recorded prior to her arrival was 100 - 100.9 F. She is not sexually active. There is no history of pyelonephritis. Associated symptoms include frequency. Pertinent negatives include no chills, hematuria, nausea or urgency. She has tried antibiotics for the symptoms. The treatment provided moderate relief. There is no history of catheterization, kidney stones, recurrent UTIs, a single kidney, urinary stasis or a urological procedure.  Sinusitis  This is a new problem. The current episode started in the past 7 days. The problem has been rapidly worsening since onset. There has been no fever. Associated symptoms include congestion, coughing and sinus pressure. Pertinent negatives include no chills, diaphoresis, ear pain, headaches, hoarse voice, neck pain, shortness of breath, sneezing, sore throat or swollen glands. Past treatments include nothing. The treatment provided mild relief.    No problem-specific Assessment & Plan notes found for this encounter.   Past Medical History:  Diagnosis Date  . Diabetes mellitus without complication ([place])   . GERD (gastroesophageal reflux disease)   . Irritable bowel syndrome   . Thyroid disease     Past Surgical History:  Procedure Laterality Date  . BREAST BIOPSY Right 2004   neg  . CHOLECYSTECTOMY    . COLONOSCOPY  2015   repeat 10 yrs- Dr Allen Norris    Family History  Problem Relation Age of Onset  . Cancer Mother   . Diabetes Mother   . Cancer Father   . Heart disease Maternal  Grandmother   . Heart disease Paternal Grandmother   . Breast cancer Neg Hx     Social History   Social History  . Marital status: Married    Spouse name: N/A  . Number of children: N/A  . Years of education: N/A   Occupational History  . Not on file.   Social History Main Topics  . Smoking status: Never Smoker  . Smokeless tobacco: Not on file  . Alcohol use 0.0 oz/week  . Drug use: No  . Sexual activity: Yes   Other Topics Concern  . Not on file   Social History Narrative  . No narrative on file    Allergies  Allergen Reactions  . Penicillins Other (See Comments)    Does not work for patient  . Hydrocodone-Acetaminophen Nausea Only and Rash     Review of Systems  Constitutional: Negative for chills, diaphoresis, fever, malaise/fatigue and weight loss.  HENT: Positive for congestion and sinus pressure. Negative for ear discharge, ear pain, hoarse voice, sneezing and sore throat.   Eyes: Negative for blurred vision.  Respiratory: Positive for cough. Negative for sputum production, shortness of breath and wheezing.   Cardiovascular: Negative for chest pain, palpitations and leg swelling.  Gastrointestinal: Negative for abdominal pain, blood in stool, constipation, diarrhea, heartburn, melena and nausea.  Genitourinary: Positive for frequency. Negative for dysuria, hematuria and urgency.  Musculoskeletal: Negative for back pain, joint pain, myalgias and neck pain.  Skin: Negative for rash.  Neurological: Negative for  dizziness, tingling, sensory change, focal weakness and headaches.  Endo/Heme/Allergies: Negative for environmental allergies and polydipsia. Does not bruise/bleed easily.  Psychiatric/Behavioral: Negative for depression and suicidal ideas. The patient is not nervous/anxious and does not have insomnia.      Objective  Vitals:   10/24/16 0917  BP: 140/80  Pulse: 88  Weight: 159 lb (72.1 kg)  Height: 5\' 6"  (1.676 m)    Physical Exam   Constitutional: She is well-developed, well-nourished, and in no distress. No distress.  HENT:  Head: Normocephalic and atraumatic.  Right Ear: External ear normal.  Left Ear: External ear normal.  Nose: Nose normal.  Mouth/Throat: Oropharynx is clear and moist.  Eyes: Conjunctivae and EOM are normal. Pupils are equal, round, and reactive to light. Right eye exhibits no discharge. Left eye exhibits no discharge.  Neck: Normal range of motion. Neck supple. No JVD present. No thyromegaly present.  Cardiovascular: Normal rate, regular rhythm, normal heart sounds and intact distal pulses.  Exam reveals no gallop and no friction rub.   No murmur heard. Pulmonary/Chest: Effort normal and breath sounds normal. She has no wheezes. She has no rales.  Abdominal: Soft. Bowel sounds are normal. She exhibits no mass. There is no tenderness. There is no guarding.  Musculoskeletal: Normal range of motion. She exhibits no edema.  Lymphadenopathy:    She has no cervical adenopathy.  Neurological: She is alert. She has normal reflexes.  Skin: Skin is warm and dry. She is not diaphoretic.  Psychiatric: Mood and affect normal.  Nursing note and vitals reviewed.     Assessment & Plan  Problem List Items Addressed This Visit    None    Visit Diagnoses    Cystitis    -  Primary   Relevant Orders   POCT urinalysis dipstick (Completed)        Dr. Otilio Miu [facility]  10/24/16

## 2017-01-29 ENCOUNTER — Other Ambulatory Visit: Payer: Self-pay

## 2017-01-29 MED ORDER — AZITHROMYCIN 250 MG PO TABS: ORAL_TABLET | ORAL | 0 refills | Status: DC

## 2017-01-30 ENCOUNTER — Ambulatory Visit (INDEPENDENT_AMBULATORY_CARE_PROVIDER_SITE_OTHER): Payer: Medicare Other | Admitting: Family Medicine

## 2017-01-30 VITALS — BP 120/80 | HR 80 | Temp 98.4°F | Ht 66.0 in | Wt 161.0 lb

## 2017-01-30 DIAGNOSIS — J40 Bronchitis, not specified as acute or chronic: Secondary | ICD-10-CM

## 2017-01-30 DIAGNOSIS — J0110 Acute frontal sinusitis, unspecified: Secondary | ICD-10-CM | POA: Diagnosis not present

## 2017-01-30 LAB — POCT INFLUENZA A/B
INFLUENZA A, POC: NEGATIVE
INFLUENZA B, POC: NEGATIVE

## 2017-01-30 MED ORDER — GUAIFENESIN-CODEINE 100-10 MG/5ML PO SYRP: 5.0000 mL | ORAL_SOLUTION | Freq: Three times a day (TID) | ORAL | 0 refills | Status: DC | PRN

## 2017-01-30 MED ORDER — AZITHROMYCIN 250 MG PO TABS: ORAL_TABLET | ORAL | 0 refills | Status: DC

## 2017-01-30 NOTE — Progress Notes (Signed)
Name: Rebecca Robbins   MRN: [id number]    DOB: Mar 10, 1946   Date:01/30/2017       Progress Note  Subjective  Chief Complaint  Chief Complaint  Patient presents with  . Cough    non productive cough, nausea, diarrhea, headache- started a ZPack yesterday    "Worst I've ever felt"   Cough  This is a new problem. The current episode started in the past 7 days. The problem has been gradually improving. The problem occurs every few minutes. The cough is productive of purulent sputum. Associated symptoms include chills, a fever, headaches, myalgias, postnasal drip, rhinorrhea, a sore throat and shortness of breath. Pertinent negatives include no chest pain, ear congestion, ear pain, heartburn, hemoptysis, nasal congestion, rash, sweats, weight loss or wheezing. Nothing aggravates the symptoms. Treatments tried: azithromycin. The treatment provided moderate relief. Her past medical history is significant for asthma. There is no history of environmental allergies.  Sinusitis  This is a new problem. The current episode started in the past 7 days. The problem has been gradually improving since onset. The pain is mild. Associated symptoms include chills, congestion, coughing, headaches, shortness of breath, sinus pressure and a sore throat. Pertinent negatives include no ear pain or neck pain. Past treatments include antibiotics. The treatment provided mild relief.    No problem-specific Assessment & Plan notes found for this encounter.   Past Medical History:  Diagnosis Date  . Diabetes mellitus without complication ([place])   . GERD (gastroesophageal reflux disease)   . Irritable bowel syndrome   . Thyroid disease     Past Surgical History:  Procedure Laterality Date  . BREAST BIOPSY Right 2004   neg  . CHOLECYSTECTOMY    . COLONOSCOPY  2015   repeat 10 yrs- Dr Allen Norris    Family History  Problem Relation Age of Onset  . Cancer Mother   . Diabetes Mother   . Cancer Father   . Heart  disease Maternal Grandmother   . Heart disease Paternal Grandmother   . Breast cancer Neg Hx     Social History   Social History  . Marital status: Married    Spouse name: N/A  . Number of children: N/A  . Years of education: N/A   Occupational History  . Not on file.   Social History Main Topics  . Smoking status: Never Smoker  . Smokeless tobacco: Not on file  . Alcohol use 0.0 oz/week  . Drug use: No  . Sexual activity: Yes   Other Topics Concern  . Not on file   Social History Narrative  . No narrative on file    Allergies  Allergen Reactions  . Penicillins Other (See Comments)    Does not work for patient  . Hydrocodone-Acetaminophen Nausea Only and Rash     Review of Systems  Constitutional: Positive for chills and fever. Negative for malaise/fatigue and weight loss.  HENT: Positive for congestion, postnasal drip, rhinorrhea, sinus pressure and sore throat. Negative for ear discharge and ear pain.   Eyes: Negative for blurred vision.  Respiratory: Positive for cough and shortness of breath. Negative for hemoptysis, sputum production and wheezing.   Cardiovascular: Negative for chest pain, palpitations and leg swelling.  Gastrointestinal: Negative for abdominal pain, blood in stool, constipation, diarrhea, heartburn, melena and nausea.  Genitourinary: Negative for dysuria, frequency, hematuria and urgency.  Musculoskeletal: Positive for myalgias. Negative for back pain, joint pain and neck pain.  Skin: Negative for rash.  Neurological: Positive  for headaches. Negative for dizziness, tingling, sensory change and focal weakness.  Endo/Heme/Allergies: Negative for environmental allergies and polydipsia. Does not bruise/bleed easily.  Psychiatric/Behavioral: Negative for depression and suicidal ideas. The patient is not nervous/anxious and does not have insomnia.      Objective  Vitals:   01/30/17 0941  BP: 120/80  Pulse: 80  Temp: 98.4 F (36.9 C)   TempSrc: Oral  Weight: 161 lb (73 kg)  Height: 5\' 6"  (1.676 m)    Physical Exam  Constitutional: She is well-developed, well-nourished, and in no distress. No distress.  HENT:  Head: Normocephalic and atraumatic.  Right Ear: Tympanic membrane, external ear and ear canal normal.  Left Ear: Tympanic membrane, external ear and ear canal normal.  Nose: Nose normal.  Mouth/Throat: Posterior oropharyngeal erythema present.  Eyes: Conjunctivae and EOM are normal. Pupils are equal, round, and reactive to light. Right eye exhibits no discharge. Left eye exhibits no discharge.  Neck: Normal range of motion. Neck supple. No JVD present. No thyromegaly present.  Cardiovascular: Normal rate, regular rhythm, normal heart sounds and intact distal pulses.  Exam reveals no gallop and no friction rub.   No murmur heard. Pulmonary/Chest: Effort normal and breath sounds normal. She has no wheezes. She has no rales.  Abdominal: Soft. Bowel sounds are normal. She exhibits no mass. There is no tenderness. There is no guarding.  Musculoskeletal: Normal range of motion. She exhibits no edema.  Lymphadenopathy:    She has no cervical adenopathy.  Neurological: She is alert.  Skin: Skin is warm and dry. She is not diaphoretic.  Psychiatric: Mood and affect normal.  Nursing note and vitals reviewed.     Assessment & Plan  Problem List Items Addressed This Visit    None    Visit Diagnoses    Acute frontal sinusitis, recurrence not specified    -  Primary   Relevant Medications   azithromycin (ZITHROMAX) 250 MG tablet   guaiFENesin-codeine (ROBITUSSIN AC) 100-10 MG/5ML syrup   Bronchitis       Relevant Medications   guaiFENesin-codeine (ROBITUSSIN AC) 100-10 MG/5ML syrup   Other Relevant Orders   POCT Influenza A/B (Completed)        Dr. Kiosha Buchan [facility]  01/30/17

## 2017-03-02 ENCOUNTER — Other Ambulatory Visit: Payer: Self-pay | Admitting: Family Medicine

## 2017-03-02 ENCOUNTER — Ambulatory Visit (INDEPENDENT_AMBULATORY_CARE_PROVIDER_SITE_OTHER): Payer: Medicare Other | Admitting: Family Medicine

## 2017-03-02 VITALS — BP 120/80 | HR 84 | Ht 66.0 in | Wt 161.0 lb

## 2017-03-02 DIAGNOSIS — E781 Pure hyperglyceridemia: Secondary | ICD-10-CM

## 2017-03-02 DIAGNOSIS — E039 Hypothyroidism, unspecified: Secondary | ICD-10-CM | POA: Diagnosis not present

## 2017-03-02 DIAGNOSIS — Z9189 Other specified personal risk factors, not elsewhere classified: Secondary | ICD-10-CM | POA: Diagnosis not present

## 2017-03-02 DIAGNOSIS — E119 Type 2 diabetes mellitus without complications: Secondary | ICD-10-CM

## 2017-03-02 DIAGNOSIS — Z23 Encounter for immunization: Secondary | ICD-10-CM | POA: Diagnosis not present

## 2017-03-02 DIAGNOSIS — R928 Other abnormal and inconclusive findings on diagnostic imaging of breast: Secondary | ICD-10-CM | POA: Insufficient documentation

## 2017-03-02 DIAGNOSIS — K580 Irritable bowel syndrome with diarrhea: Secondary | ICD-10-CM | POA: Diagnosis not present

## 2017-03-02 MED ORDER — LEVOTHYROXINE SODIUM 25 MCG PO TABS: 25.0000 ug | ORAL_TABLET | Freq: Every day | ORAL | 1 refills | Status: DC

## 2017-03-02 MED ORDER — CHOLESTYRAMINE LIGHT 4 GM/DOSE PO POWD: 4.0000 g | Freq: Every day | ORAL | 3 refills | Status: DC

## 2017-03-02 MED ORDER — METFORMIN HCL 500 MG PO TABS: 500.0000 mg | ORAL_TABLET | Freq: Two times a day (BID) | ORAL | 1 refills | Status: DC

## 2017-03-02 NOTE — Progress Notes (Signed)
Name: Rebecca Robbins   MRN: [id number]    DOB: July 18, 1946   Date:03/02/2017       Progress Note  Subjective  Chief Complaint  Chief Complaint  Patient presents with  . Diabetes  . Hyperlipidemia  . Hypothyroidism  . breast exam    bilateral mammo with mag views of R) breast- 1 year follow up    Patient needs followup for mammagram recheck.   Diabetes  She presents for her follow-up diabetic visit. She has type 2 diabetes mellitus. Her disease course has been stable. Pertinent negatives for hypoglycemia include no confusion, dizziness, headaches, hunger, mood changes, nervousness/anxiousness, pallor, seizures, sleepiness, speech difficulty, sweats or tremors. Associated symptoms include fatigue. Pertinent negatives for diabetes include no blurred vision, no chest pain, no foot paresthesias, no foot ulcerations, no polydipsia, no polyphagia, no polyuria, no visual change, no weakness and no weight loss. There are no hypoglycemic complications. Symptoms are stable. There are no diabetic complications. Risk factors for coronary artery disease include diabetes mellitus, dyslipidemia and post-menopausal. Current diabetic treatment includes oral agent (monotherapy). She is compliant with treatment all of the time. Her weight is stable. She is following a generally healthy diet. She has not had a previous visit with a dietitian. She participates in exercise daily. Her breakfast blood glucose is taken between 8-9 am. Her breakfast blood glucose range is generally 130-140 mg/dl. An ACE inhibitor/angiotensin II receptor blocker is not being taken. She does not see a podiatrist.Eye exam is not current.  Hyperlipidemia  This is a chronic problem. The current episode started more than 1 year ago. The problem is controlled. Recent lipid tests were reviewed and are normal. Exacerbating diseases include diabetes and hypothyroidism. She has no history of chronic renal disease. Pertinent negatives include no  chest pain, focal weakness, myalgias or shortness of breath. Treatments tried: cholestiraline. The current treatment provides mild improvement of lipids. There are no compliance problems.  There are no known risk factors for coronary artery disease.  Thyroid Problem  Presents for follow-up visit. Symptoms include fatigue. Patient reports no anxiety, cold intolerance, constipation, depressed mood, diaphoresis, diarrhea, dry skin, hair loss, heat intolerance, hoarse voice, leg swelling, menstrual problem, nail problem, palpitations, tremors, visual change, weight gain or weight loss. The symptoms have been stable. Her past medical history is significant for diabetes and hyperlipidemia.    No problem-specific Assessment & Plan notes found for this encounter.   Past Medical History:  Diagnosis Date  . Diabetes mellitus without complication ([place])   . GERD (gastroesophageal reflux disease)   . Irritable bowel syndrome   . Thyroid disease     Past Surgical History:  Procedure Laterality Date  . BREAST BIOPSY Right 2004   neg  . CHOLECYSTECTOMY    . COLONOSCOPY  2015   repeat 10 yrs- Dr Allen Norris    Family History  Problem Relation Age of Onset  . Cancer Mother   . Diabetes Mother   . Cancer Father   . Heart disease Maternal Grandmother   . Heart disease Paternal Grandmother   . Breast cancer Neg Hx     Social History   Social History  . Marital status: Married    Spouse name: N/A  . Number of children: N/A  . Years of education: N/A   Occupational History  . Not on file.   Social History Main Topics  . Smoking status: Never Smoker  . Smokeless tobacco: Not on file  . Alcohol use 0.0 oz/week  .  Drug use: No  . Sexual activity: Yes   Other Topics Concern  . Not on file   Social History Narrative  . No narrative on file    Allergies  Allergen Reactions  . Penicillins Other (See Comments)    Does not work for patient  . Azithromycin Rash  . Hydrocodone-Acetaminophen  Nausea Only and Rash    Outpatient Medications Prior to Visit  Medication Sig Dispense Refill  . omeprazole (PRILOSEC) 20 MG capsule Take 1 capsule (20 mg total) by mouth as needed. otc 30 capsule 6  . cholestyramine light (PREVALITE) 4 GM/DOSE powder Take 1 packet (4 g total) by mouth daily. 378 g 0  . glipiZIDE (GLUCOTROL XL) 2.5 MG 24 hr tablet Take 1 tablet (2.5 mg total) by mouth daily with breakfast. 30 tablet 1  . levothyroxine (SYNTHROID, LEVOTHROID) 25 MCG tablet Take 1 tablet (25 mcg total) by mouth daily. 90 tablet 1  . metFORMIN (GLUCOPHAGE) 500 MG tablet Take 1 tablet (500 mg total) by mouth daily. (Patient taking differently: Take 500 mg by mouth 2 (two) times daily with a meal. ) 90 tablet 1  . etodolac (LODINE XL) 400 MG 24 hr tablet Take 1 tablet (400 mg total) by mouth daily. (Patient not taking: Reported on 03/02/2017) 90 tablet 1  . azithromycin (ZITHROMAX) 250 MG tablet Use as directed 6 tablet 0  . azithromycin (ZITHROMAX) 250 MG tablet 2 today then 1 a day for 4 days 6 tablet 0  . guaiFENesin-codeine (ROBITUSSIN AC) 100-10 MG/5ML syrup Take 5 mLs by mouth 3 (three) times daily as needed for cough. 150 mL 0  . ibuprofen (ADVIL,MOTRIN) 200 MG tablet Take 1 tablet by mouth every 8 (eight) hours as needed.     No facility-administered medications prior to visit.     Review of Systems  Constitutional: Positive for fatigue. Negative for chills, diaphoresis, fever, malaise/fatigue, weight gain and weight loss.  HENT: Negative for ear discharge, ear pain, hoarse voice and sore throat.   Eyes: Negative for blurred vision.  Respiratory: Negative for cough, sputum production, shortness of breath and wheezing.   Cardiovascular: Negative for chest pain, palpitations and leg swelling.  Gastrointestinal: Negative for abdominal pain, blood in stool, constipation, diarrhea, heartburn, melena and nausea.  Genitourinary: Negative for dysuria, frequency, hematuria, menstrual problem and  urgency.  Musculoskeletal: Negative for back pain, joint pain, myalgias and neck pain.  Skin: Negative for pallor and rash.  Neurological: Negative for dizziness, tingling, tremors, sensory change, focal weakness, seizures, speech difficulty, weakness and headaches.  Endo/Heme/Allergies: Negative for environmental allergies, cold intolerance, heat intolerance, polydipsia and polyphagia. Does not bruise/bleed easily.  Psychiatric/Behavioral: Negative for confusion, depression and suicidal ideas. The patient is not nervous/anxious and does not have insomnia.      Objective  Vitals:   03/02/17 0923  BP: 120/80  Pulse: 84  Weight: 161 lb (73 kg)  Height: 5\' 6"  (1.676 m)    Physical Exam  Constitutional: She is well-developed, well-nourished, and in no distress. No distress.  HENT:  Head: Normocephalic and atraumatic.  Right Ear: External ear normal.  Left Ear: External ear normal.  Nose: Nose normal.  Mouth/Throat: Oropharynx is clear and moist.  Eyes: Conjunctivae and EOM are normal. Pupils are equal, round, and reactive to light. Right eye exhibits no discharge. Left eye exhibits no discharge.  Neck: Normal range of motion. Neck supple. No JVD present. No thyromegaly present.  Cardiovascular: Normal rate, regular rhythm, normal heart sounds and intact distal  pulses.  Exam reveals no gallop and no friction rub.   No murmur heard. Pulmonary/Chest: Effort normal and breath sounds normal. She has no wheezes. She has no rales. Right breast exhibits no inverted nipple, no mass, no nipple discharge, no skin change and no tenderness. Left breast exhibits no inverted nipple, no mass, no nipple discharge, no skin change and no tenderness. Breasts are symmetrical.  Abdominal: Soft. Bowel sounds are normal. She exhibits no mass. There is no tenderness. There is no guarding.  Musculoskeletal: Normal range of motion. She exhibits no edema.  Lymphadenopathy:    She has no cervical adenopathy.   Neurological: She is alert. She has normal reflexes.  Skin: Skin is warm and dry. She is not diaphoretic.  Psychiatric: Mood and affect normal.  Nursing note and vitals reviewed.     Assessment & Plan  Problem List Items Addressed This Visit      Digestive   Irritable bowel syndrome with diarrhea   Relevant Medications   cholestyramine light (PREVALITE) 4 GM/DOSE powder     Endocrine   Type 2 diabetes mellitus without complication, without long-term current use of insulin ([facility])   Relevant Medications   metFORMIN (GLUCOPHAGE) 500 MG tablet   Other Relevant Orders   Hemoglobin A1c   Renal Function Panel   Microalbumin / creatinine urine ratio   Adult hypothyroidism - Primary   Relevant Medications   levothyroxine (SYNTHROID, LEVOTHROID) 25 MCG tablet     Other   Abnormal mammogram   Relevant Orders   MM Digital Diagnostic Bilat   Pure hyperglyceridemia   Relevant Medications   cholestyramine light (PREVALITE) 4 GM/DOSE powder   Other Relevant Orders   Lipid Profile    Other Visit Diagnoses    Pneumococcal vaccination indicated       Relevant Orders   Pneumococcal polysaccharide vaccine 23-valent greater than or equal to 2yo subcutaneous/IM (Completed)   Need for pneumococcal vaccine       Relevant Orders   Pneumococcal polysaccharide vaccine 23-valent greater than or equal to 2yo subcutaneous/IM (Completed)      Meds ordered this encounter  Medications  . levothyroxine (SYNTHROID, LEVOTHROID) 25 MCG tablet    Sig: Take 1 tablet (25 mcg total) by mouth daily.    Dispense:  90 tablet    Refill:  1  . cholestyramine light (PREVALITE) 4 GM/DOSE powder    Sig: Take 1 packet (4 g total) by mouth daily.    Dispense:  378 g    Refill:  3    Pt has been getting a 378 Gram can and taking 1 tsp every day as directed. Please fill as been getting- Thank you  . metFORMIN (GLUCOPHAGE) 500 MG tablet    Sig: Take 1 tablet (500 mg total) by mouth 2 (two) times daily with a  meal.    Dispense:  180 tablet    Refill:  1      Dr. [facility]  03/02/17

## 2017-03-03 LAB — LIPID PANEL
Chol/HDL Ratio: 4.1 ratio units (ref 0.0–4.4)
Cholesterol, Total: 248 mg/dL — ABNORMAL HIGH (ref 100–199)
HDL: 61 mg/dL (ref >39)
LDL Calculated: 159 mg/dL — ABNORMAL HIGH (ref 0–99)
TRIGLYCERIDES: 141 mg/dL (ref 0–149)
VLDL CHOLESTEROL CAL: 28 mg/dL (ref 5–40)

## 2017-03-03 LAB — RENAL FUNCTION PANEL
Albumin: 4.9 g/dL — ABNORMAL HIGH (ref 3.5–4.8)
BUN/Creatinine Ratio: 11 — ABNORMAL LOW (ref 12–28)
BUN: 7 mg/dL — ABNORMAL LOW (ref 8–27)
CALCIUM: 9.8 mg/dL (ref 8.7–10.3)
CHLORIDE: 101 mmol/L (ref 96–106)
CO2: 22 mmol/L (ref 18–29)
CREATININE: 0.66 mg/dL (ref 0.57–1.00)
GFR calc Af Amer: 103 mL/min/{1.73_m2} (ref >59)
GFR, EST NON AFRICAN AMERICAN: 89 mL/min/{1.73_m2} (ref >59)
Glucose: 136 mg/dL — ABNORMAL HIGH (ref 65–99)
POTASSIUM: 4.3 mmol/L (ref 3.5–5.2)
Phosphorus: 3.4 mg/dL (ref 2.5–4.5)
SODIUM: 142 mmol/L (ref 134–144)

## 2017-03-03 LAB — MICROALBUMIN / CREATININE URINE RATIO
Creatinine, Urine: 17.9 mg/dL
Microalb/Creat Ratio: <16.8 mg/g creat (ref 0.0–30.0)
Microalbumin, Urine: <3 ug/mL

## 2017-03-03 LAB — TSH: TSH: 3.26 u[IU]/mL (ref 0.450–4.500)

## 2017-03-03 LAB — HEMOGLOBIN A1C
ESTIMATED AVERAGE GLUCOSE: 154 mg/dL
HEMOGLOBIN A1C: 7 % — AB (ref 4.8–5.6)

## 2017-03-13 ENCOUNTER — Ambulatory Visit: Payer: No Typology Code available for payment source

## 2017-03-20 ENCOUNTER — Ambulatory Visit
Admission: RE | Admit: 2017-03-20 | Discharge: 2017-03-20 | Disposition: A | Discharge status: Home / Self Care | Payer: Medicare Other | Source: Ambulatory Visit | Attending: Family Medicine | Admitting: Family Medicine

## 2017-03-20 DIAGNOSIS — C50911 Malignant neoplasm of unspecified site of right female breast: Secondary | ICD-10-CM | POA: Insufficient documentation

## 2017-03-20 DIAGNOSIS — R928 Other abnormal and inconclusive findings on diagnostic imaging of breast: Secondary | ICD-10-CM

## 2017-03-20 DIAGNOSIS — C50912 Malignant neoplasm of unspecified site of left female breast: Secondary | ICD-10-CM | POA: Insufficient documentation

## 2017-03-20 DIAGNOSIS — R921 Mammographic calcification found on diagnostic imaging of breast: Secondary | ICD-10-CM | POA: Diagnosis not present

## 2017-09-04 ENCOUNTER — Ambulatory Visit (INDEPENDENT_AMBULATORY_CARE_PROVIDER_SITE_OTHER): Payer: Medicare Other | Admitting: Family Medicine

## 2017-09-04 ENCOUNTER — Encounter: Payer: Self-pay | Admitting: Family Medicine

## 2017-09-04 VITALS — BP 130/76 | HR 80 | Ht 66.0 in | Wt 159.0 lb

## 2017-09-04 DIAGNOSIS — Z23 Encounter for immunization: Secondary | ICD-10-CM

## 2017-09-04 DIAGNOSIS — E119 Type 2 diabetes mellitus without complications: Secondary | ICD-10-CM | POA: Diagnosis not present

## 2017-09-04 DIAGNOSIS — K580 Irritable bowel syndrome with diarrhea: Secondary | ICD-10-CM

## 2017-09-04 DIAGNOSIS — F3341 Major depressive disorder, recurrent, in partial remission: Secondary | ICD-10-CM | POA: Diagnosis not present

## 2017-09-04 DIAGNOSIS — E039 Hypothyroidism, unspecified: Secondary | ICD-10-CM | POA: Diagnosis not present

## 2017-09-04 MED ORDER — CHOLESTYRAMINE LIGHT 4 GM/DOSE PO POWD: 4.0000 g | Freq: Every day | ORAL | 11 refills | Status: DC

## 2017-09-04 MED ORDER — SERTRALINE HCL 25 MG PO TABS: 25.0000 mg | ORAL_TABLET | Freq: Every day | ORAL | 6 refills | Status: DC

## 2017-09-04 NOTE — Progress Notes (Signed)
Name: Rebecca Robbins   MRN: [id number]    DOB: [birth year]   Date:09/04/2017       Progress Note  Subjective  Chief Complaint  Chief Complaint  Patient presents with  . Diabetes  . Hypothyroidism    Diabetes  She presents for her follow-up diabetic visit. She has type 2 diabetes mellitus. Her disease course has been stable. There are no hypoglycemic associated symptoms. Pertinent negatives for hypoglycemia include no dizziness, headaches, nervousness/anxiousness, sweats or tremors. Associated symptoms include fatigue. Pertinent negatives for diabetes include no blurred vision, no chest pain, no foot paresthesias, no foot ulcerations, no polydipsia, no polyphagia, no polyuria, no visual change, no weakness and no weight loss. There are no hypoglycemic complications. Symptoms are stable. There are no diabetic complications. There are no known risk factors for coronary artery disease. Current diabetic treatment includes oral agent (monotherapy). Her weight is stable. She is following a generally healthy diet. She participates in exercise intermittently. Her home blood glucose trend is fluctuating minimally. Her breakfast blood glucose is taken between 8-9 am. Her breakfast blood glucose range is generally 140-180 mg/dl. An ACE inhibitor/angiotensin II receptor blocker is being taken.  Diarrhea   This is a chronic problem. The current episode started more than 1 year ago. The problem has been gradually improving. The stool consistency is described as mucous. The patient states that diarrhea awakens her from sleep. Pertinent negatives include no abdominal pain, arthralgias, bloating, chills, coughing, fever, headaches, increased  flatus, myalgias, sweats, URI, vomiting or weight loss. Exacerbated by: s/p clolecystectomy. There are no known risk factors. She has tried nothing for the symptoms.  Thyroid Problem  Presents for follow-up visit. Symptoms include depressed mood, diarrhea and fatigue. Patient  reports no anxiety, cold intolerance, constipation, diaphoresis, dry skin, hair loss, heat intolerance, hoarse voice, leg swelling, nail problem, palpitations, tremors, visual change, weight gain or weight loss.    No problem-specific Assessment & Plan notes found for this encounter.   Past Medical History:  Diagnosis Date  . Diabetes mellitus without complication ([place])   . GERD (gastroesophageal reflux disease)   . Irritable bowel syndrome   . Thyroid disease     Past Surgical History:  Procedure Laterality Date  . BREAST BIOPSY Right 2004   neg  . CHOLECYSTECTOMY    . COLONOSCOPY  2015   repeat 10 yrs- Dr Allen Norris    Family History  Problem Relation Age of Onset  . Cancer Mother   . Diabetes Mother   . Cancer Father   . Heart disease Maternal Grandmother   . Heart disease Paternal Grandmother   . Breast cancer Neg Hx     Social History   Social History  . Marital status: Married    Spouse name: N/A  . Number of children: N/A  . Years of education: N/A   Occupational History  . Not on file.   Social History Main Topics  . Smoking status: Never Smoker  . Smokeless tobacco: Never Used  . Alcohol use 0.0 oz/week  . Drug use: No  . Sexual activity: Yes   Other Topics Concern  . Not on file   Social History Narrative  . No narrative on file    Allergies  Allergen Reactions  . Penicillins Other (See Comments)    Does not work for patient  . Azithromycin Rash  . Hydrocodone-Acetaminophen Nausea Only and Rash    Outpatient Medications Prior to Visit  Medication Sig Dispense Refill  . levothyroxine (SYNTHROID,  LEVOTHROID) 25 MCG tablet Take 1 tablet (25 mcg total) by mouth daily. 90 tablet 1  . omeprazole (PRILOSEC) 20 MG capsule Take 1 capsule (20 mg total) by mouth as needed. otc 30 capsule 6  . cholestyramine light (PREVALITE) 4 GM/DOSE powder Take 1 packet (4 g total) by mouth daily. 378 g 3  . metFORMIN (GLUCOPHAGE) 500 MG tablet Take 1 tablet (500 mg  total) by mouth 2 (two) times daily with a meal. 180 tablet 1  . etodolac (LODINE XL) 400 MG 24 hr tablet Take 1 tablet (400 mg total) by mouth daily. (Patient not taking: Reported on 03/02/2017) 90 tablet 1   No facility-administered medications prior to visit.     Review of Systems  Constitutional: Positive for fatigue. Negative for chills, diaphoresis, fever, malaise/fatigue, weight gain and weight loss.  HENT: Negative for ear discharge, ear pain, hoarse voice and sore throat.   Eyes: Negative for blurred vision.  Respiratory: Negative for cough, sputum production, shortness of breath and wheezing.   Cardiovascular: Negative for chest pain, palpitations and leg swelling.  Gastrointestinal: Positive for diarrhea. Negative for abdominal pain, bloating, blood in stool, constipation, flatus, heartburn, melena, nausea and vomiting.  Genitourinary: Negative for dysuria, frequency, hematuria and urgency.  Musculoskeletal: Negative for arthralgias, back pain, joint pain, myalgias and neck pain.  Skin: Negative for rash.  Neurological: Negative for dizziness, tingling, tremors, sensory change, focal weakness, weakness and headaches.  Endo/Heme/Allergies: Negative for environmental allergies, cold intolerance, heat intolerance, polydipsia and polyphagia. Does not bruise/bleed easily.  Psychiatric/Behavioral: Negative for depression and suicidal ideas. The patient is not nervous/anxious and does not have insomnia.      Objective  Vitals:   09/04/17 1049  BP: 130/76  Pulse: 80  Weight: 159 lb (72.1 kg)  Height: 5\' 6"  (1.676 m)    Physical Exam  Constitutional: She is well-developed, well-nourished, and in no distress. No distress.  HENT:  Head: Normocephalic and atraumatic.  Right Ear: External ear normal.  Left Ear: External ear normal.  Nose: Nose normal.  Mouth/Throat: Oropharynx is clear and moist.  Eyes: Pupils are equal, round, and reactive to light. Conjunctivae and EOM are  normal. Right eye exhibits no discharge. Left eye exhibits no discharge.  Neck: Normal range of motion. Neck supple. No JVD present. No thyromegaly present.  Cardiovascular: Normal rate, regular rhythm, normal heart sounds and intact distal pulses.  Exam reveals no gallop and no friction rub.   No murmur heard. Pulmonary/Chest: Effort normal and breath sounds normal. She has no wheezes. She has no rales.  Abdominal: Soft. Bowel sounds are normal. She exhibits no mass. There is no tenderness. There is no rebound and no guarding.  Musculoskeletal: Normal range of motion. She exhibits no edema.  Lymphadenopathy:    She has no cervical adenopathy.  Neurological: She is alert. She has normal reflexes.  Skin: Skin is warm and dry. She is not diaphoretic.  Psychiatric: Mood and affect normal.      Assessment & Plan  Problem List Items Addressed This Visit      Digestive   Irritable bowel syndrome with diarrhea   Relevant Medications   cholestyramine light (PREVALITE) 4 GM/DOSE powder     Endocrine   Type 2 diabetes mellitus without complication, without long-term current use of insulin (HCC)   Relevant Orders   Renal function panel   Hemoglobin A1c   Lipid Profile   Adult hypothyroidism - Primary   Relevant Orders   TSH  Other Visit Diagnoses    Recurrent major depressive disorder, in partial remission (HCC)       Relevant Medications   sertraline (ZOLOFT) 25 MG tablet   Influenza vaccine needed       Relevant Orders   Flu vaccine HIGH DOSE PF (Fluzone High dose) (Completed)      Meds ordered this encounter  Medications  . cholestyramine light (PREVALITE) 4 GM/DOSE powder    Sig: Take 1 packet (4 g total) by mouth daily.    Dispense:  378 g    Refill:  11    Pt has been getting a 378 Gram can and taking 1 tsp every day as directed. Please fill as been getting- Thank you  . sertraline (ZOLOFT) 25 MG tablet    Sig: Take 1 tablet (25 mg total) by mouth daily.     Dispense:  30 tablet    Refill:  6      Dr. Otilio Miu  [facility]  09/04/17

## 2017-09-05 LAB — RENAL FUNCTION PANEL
Albumin: 5 g/dL — ABNORMAL HIGH (ref 3.5–4.8)
BUN / CREAT RATIO: 16 (ref 12–28)
BUN: 10 mg/dL (ref 8–27)
CALCIUM: 10.1 mg/dL (ref 8.7–10.3)
CO2: 21 mmol/L (ref 20–29)
Chloride: 103 mmol/L (ref 96–106)
Creatinine, Ser: 0.61 mg/dL (ref 0.57–1.00)
GFR calc Af Amer: 105 mL/min/{1.73_m2} (ref >59)
GFR calc non Af Amer: 91 mL/min/{1.73_m2} (ref >59)
GLUCOSE: 121 mg/dL — AB (ref 65–99)
Phosphorus: 3.5 mg/dL (ref 2.5–4.5)
Potassium: 4.7 mmol/L (ref 3.5–5.2)
Sodium: 142 mmol/L (ref 134–144)

## 2017-09-05 LAB — HEMOGLOBIN A1C
ESTIMATED AVERAGE GLUCOSE: 154 mg/dL
HEMOGLOBIN A1C: 7 % — AB (ref 4.8–5.6)

## 2017-09-05 LAB — LIPID PANEL
Chol/HDL Ratio: 3.3 ratio (ref 0.0–4.4)
Cholesterol, Total: 224 mg/dL — ABNORMAL HIGH (ref 100–199)
HDL: 67 mg/dL (ref >39)
LDL Calculated: 129 mg/dL — ABNORMAL HIGH (ref 0–99)
TRIGLYCERIDES: 142 mg/dL (ref 0–149)
VLDL Cholesterol Cal: 28 mg/dL (ref 5–40)

## 2017-09-05 LAB — TSH: TSH: 4.32 u[IU]/mL (ref 0.450–4.500)

## 2017-09-08 ENCOUNTER — Other Ambulatory Visit: Payer: Self-pay

## 2017-09-08 DIAGNOSIS — E039 Hypothyroidism, unspecified: Secondary | ICD-10-CM

## 2017-09-08 MED ORDER — LEVOTHYROXINE SODIUM 25 MCG PO TABS: 25.0000 ug | ORAL_TABLET | Freq: Every day | ORAL | 1 refills | Status: DC

## 2017-10-16 ENCOUNTER — Telehealth: Payer: Self-pay

## 2017-10-16 ENCOUNTER — Other Ambulatory Visit: Payer: Self-pay

## 2017-10-16 DIAGNOSIS — B3731 Acute candidiasis of vulva and vagina: Secondary | ICD-10-CM

## 2017-10-16 DIAGNOSIS — B373 Candidiasis of vulva and vagina: Secondary | ICD-10-CM

## 2017-10-16 MED ORDER — METFORMIN HCL ER 500 MG PO TB24: 500.0000 mg | ORAL_TABLET | Freq: Every day | ORAL | 1 refills | Status: DC

## 2017-10-16 NOTE — Telephone Encounter (Signed)
Pt called stating that she had went off her Metformin 500mg  BID x 6 weeks ago to see if her "stomach issues" subsided. She stated that her stomach has felt better and she would "like to try the extended release metformin"- called Metformin XR 500mg  qday #30 with 1 refill to [facility] and told her we would recheck in 6 weeks

## 2017-10-21 DIAGNOSIS — H902 Conductive hearing loss, unspecified: Secondary | ICD-10-CM | POA: Diagnosis not present

## 2017-10-21 DIAGNOSIS — H6123 Impacted cerumen, bilateral: Secondary | ICD-10-CM | POA: Diagnosis not present

## 2017-12-10 MED ORDER — FLUCONAZOLE 150 MG PO TABS: 150.0000 mg | ORAL_TABLET | Freq: Once | ORAL | 0 refills | Status: AC

## 2017-12-16 ENCOUNTER — Other Ambulatory Visit: Payer: Self-pay

## 2017-12-16 MED ORDER — GLIPIZIDE 5 MG PO TABS: 5.0000 mg | ORAL_TABLET | Freq: Every day | ORAL | 0 refills | Status: DC

## 2017-12-16 MED ORDER — METFORMIN HCL ER 500 MG PO TB24: 500.0000 mg | ORAL_TABLET | Freq: Every day | ORAL | 1 refills | Status: DC

## 2017-12-24 ENCOUNTER — Telehealth: Payer: Self-pay | Admitting: Family Medicine

## 2017-12-24 NOTE — Telephone Encounter (Signed)
Called pt to sched for AWV with Nurse Health Advisor.  C/b #  [phone] on Skype [email or web address] if you have questions ° °

## 2018-01-06 ENCOUNTER — Ambulatory Visit (INDEPENDENT_AMBULATORY_CARE_PROVIDER_SITE_OTHER): Payer: Medicare Other | Admitting: Family Medicine

## 2018-01-06 ENCOUNTER — Encounter: Payer: Self-pay | Admitting: Family Medicine

## 2018-01-06 VITALS — BP 138/88 | HR 72 | Ht 66.0 in | Wt 153.0 lb

## 2018-01-06 DIAGNOSIS — S61431A Puncture wound without foreign body of right hand, initial encounter: Secondary | ICD-10-CM | POA: Diagnosis not present

## 2018-01-06 DIAGNOSIS — K219 Gastro-esophageal reflux disease without esophagitis: Secondary | ICD-10-CM | POA: Diagnosis not present

## 2018-01-06 DIAGNOSIS — E039 Hypothyroidism, unspecified: Secondary | ICD-10-CM

## 2018-01-06 DIAGNOSIS — K580 Irritable bowel syndrome with diarrhea: Secondary | ICD-10-CM

## 2018-01-06 DIAGNOSIS — E119 Type 2 diabetes mellitus without complications: Secondary | ICD-10-CM | POA: Diagnosis not present

## 2018-01-06 DIAGNOSIS — F3341 Major depressive disorder, recurrent, in partial remission: Secondary | ICD-10-CM | POA: Insufficient documentation

## 2018-01-06 DIAGNOSIS — Z1159 Encounter for screening for other viral diseases: Secondary | ICD-10-CM | POA: Diagnosis not present

## 2018-01-06 MED ORDER — OMEPRAZOLE 20 MG PO CPDR: 20.0000 mg | DELAYED_RELEASE_CAPSULE | ORAL | 6 refills | Status: DC | PRN

## 2018-01-06 MED ORDER — LEVOTHYROXINE SODIUM 25 MCG PO TABS: 25.0000 ug | ORAL_TABLET | Freq: Every day | ORAL | 1 refills | Status: DC

## 2018-01-06 MED ORDER — CHOLESTYRAMINE LIGHT 4 GM/DOSE PO POWD
4.0000 g | Freq: Every day | ORAL | 11 refills | Status: DC
Start: 2018-01-06 — End: 2018-08-23

## 2018-01-06 MED ORDER — MUPIROCIN 2 % EX OINT: 1.0000 "application " | TOPICAL_OINTMENT | Freq: Two times a day (BID) | CUTANEOUS | 0 refills | Status: DC

## 2018-01-06 MED ORDER — GLIPIZIDE 5 MG PO TABS: 5.0000 mg | ORAL_TABLET | Freq: Every day | ORAL | 1 refills | Status: DC

## 2018-01-06 MED ORDER — CEPHALEXIN 500 MG PO CAPS: 500.0000 mg | ORAL_CAPSULE | Freq: Four times a day (QID) | ORAL | 0 refills | Status: DC

## 2018-01-06 MED ORDER — METFORMIN HCL ER 500 MG PO TB24: 500.0000 mg | ORAL_TABLET | Freq: Every day | ORAL | 1 refills | Status: DC

## 2018-01-06 NOTE — Progress Notes (Signed)
Name: Rebecca Robbins   MRN: [id number]    DOB: 04/20/1946   Date:01/06/2018       Progress Note  Subjective  Chief Complaint  Chief Complaint  Patient presents with  . Hypothyroidism  . Diabetes    115-160    Diabetes  She presents for her follow-up diabetic visit. She has type 2 diabetes mellitus. Her disease course has been stable. Pertinent negatives for hypoglycemia include no dizziness, headaches or nervousness/anxiousness. Associated symptoms include weight loss. Pertinent negatives for diabetes include no blurred vision, no chest pain, no fatigue, no foot paresthesias, no foot ulcerations, no polydipsia, no polyphagia, no polyuria, no visual change and no weakness. There are no hypoglycemic complications. There are no diabetic complications. Risk factors for coronary artery disease include diabetes mellitus and dyslipidemia. Current diabetic treatment includes oral agent (dual therapy). She is compliant with treatment all of the time. She participates in exercise weekly. Her breakfast blood glucose is taken between 8-9 am. Her breakfast blood glucose range is generally 140-180 mg/dl. An ACE inhibitor/angiotensin II receptor blocker is not being taken. She does not see a podiatrist.Eye exam is not current.  Thyroid Problem  Presents for follow-up visit. Symptoms include weight loss. Patient reports no anxiety, cold intolerance, constipation, depressed mood, diaphoresis, diarrhea, dry skin, fatigue, hair loss, hoarse voice, menstrual problem, nail problem, palpitations, visual change or weight gain. The symptoms have been stable. Her past medical history is significant for hyperlipidemia. There is no history of diabetes.  Hyperlipidemia  This is a chronic problem. The problem is controlled. Recent lipid tests were reviewed and are normal. She has no history of chronic renal disease, diabetes, hypothyroidism, liver disease, obesity or nephrotic syndrome. There are no known factors aggravating  her hyperlipidemia. Pertinent negatives include no chest pain, focal weakness, myalgias or shortness of breath. Current antihyperlipidemic treatment includes exercise and diet change. The current treatment provides moderate improvement of lipids. There are no compliance problems.  Risk factors for coronary artery disease include dyslipidemia, diabetes mellitus, stress and post-menopausal.  Laceration      No problem-specific Assessment & Plan notes found for this encounter.   Past Medical History:  Diagnosis Date  . Diabetes mellitus without complication ([place])   . GERD (gastroesophageal reflux disease)   . Irritable bowel syndrome   . Thyroid disease     Past Surgical History:  Procedure Laterality Date  . BREAST BIOPSY Right 2004   neg  . CHOLECYSTECTOMY    . COLONOSCOPY  2015   repeat 10 yrs- Dr Allen Norris    Family History  Problem Relation Age of Onset  . Cancer Mother   . Diabetes Mother   . Cancer Father   . Heart disease Maternal Grandmother   . Heart disease Paternal Grandmother   . Breast cancer Neg Hx     Social History   Socioeconomic History  . Marital status: Married    Spouse name: Not on file  . Number of children: Not on file  . Years of education: Not on file  . Highest education level: Not on file  Social Needs  . Financial resource strain: Not on file  . Food insecurity - worry: Not on file  . Food insecurity - inability: Not on file  . Transportation needs - medical: Not on file  . Transportation needs - non-medical: Not on file  Occupational History  . Not on file  Tobacco Use  . Smoking status: Never Smoker  . Smokeless tobacco: Never Used  Substance and Sexual Activity  . Alcohol use: Yes    Alcohol/week: 0.0 oz  . Drug use: No  . Sexual activity: Yes  Other Topics Concern  . Not on file  Social History Narrative  . Not on file    Allergies  Allergen Reactions  . Penicillins Other (See Comments)    Does not work for patient  .  Azithromycin Rash  . Hydrocodone-Acetaminophen Nausea Only and Rash    Outpatient Medications Prior to Visit  Medication Sig Dispense Refill  . cholestyramine light (PREVALITE) 4 GM/DOSE powder Take 1 packet (4 g total) by mouth daily. 378 g 11  . glipiZIDE (GLUCOTROL) 5 MG tablet Take 1 tablet (5 mg total) by mouth daily before breakfast. 30 tablet 0  . levothyroxine (SYNTHROID, LEVOTHROID) 25 MCG tablet Take 1 tablet (25 mcg total) by mouth daily. 90 tablet 1  . metFORMIN (GLUCOPHAGE XR) 500 MG 24 hr tablet Take 1 tablet (500 mg total) by mouth daily with breakfast. 30 tablet 1  . omeprazole (PRILOSEC) 20 MG capsule Take 1 capsule (20 mg total) by mouth as needed. otc 30 capsule 6  . sertraline (ZOLOFT) 25 MG tablet Take 1 tablet (25 mg total) by mouth daily. 30 tablet 6  . etodolac (LODINE XL) 400 MG 24 hr tablet Take 1 tablet (400 mg total) by mouth daily. (Patient not taking: Reported on 03/02/2017) 90 tablet 1   No facility-administered medications prior to visit.     Review of Systems  Constitutional: Positive for weight loss. Negative for chills, diaphoresis, fatigue, fever, malaise/fatigue and weight gain.  HENT: Negative for ear discharge, ear pain, hoarse voice and sore throat.   Eyes: Negative for blurred vision.  Respiratory: Negative for cough, sputum production, shortness of breath and wheezing.   Cardiovascular: Negative for chest pain, palpitations and leg swelling.  Gastrointestinal: Negative for abdominal pain, blood in stool, constipation, diarrhea, heartburn, melena and nausea.  Genitourinary: Negative for dysuria, frequency, hematuria, menstrual problem and urgency.  Musculoskeletal: Negative for back pain, joint pain, myalgias and neck pain.  Skin: Negative for rash.  Neurological: Negative for dizziness, tingling, sensory change, focal weakness, weakness and headaches.  Endo/Heme/Allergies: Negative for environmental allergies, cold intolerance, polydipsia and  polyphagia. Does not bruise/bleed easily.  Psychiatric/Behavioral: Negative for depression and suicidal ideas. The patient is not nervous/anxious and does not have insomnia.      Objective  Vitals:   01/06/18 1030  BP: 138/88  Pulse: 72  Weight: 153 lb (69.4 kg)  Height: 5\' 6"  (1.676 m)    Physical Exam  Constitutional: She is well-developed, well-nourished, and in no distress. No distress.  HENT:  Head: Normocephalic and atraumatic.  Right Ear: External ear normal.  Left Ear: External ear normal.  Nose: Nose normal.  Mouth/Throat: Oropharynx is clear and moist.  Eyes: Conjunctivae and EOM are normal. Pupils are equal, round, and reactive to light. Right eye exhibits no discharge. Left eye exhibits no discharge.  Neck: Normal range of motion. Neck supple. No JVD present. No thyromegaly present.  Cardiovascular: Normal rate, regular rhythm, normal heart sounds and intact distal pulses. Exam reveals no gallop and no friction rub.  No murmur heard. Pulmonary/Chest: Effort normal and breath sounds normal. She has no wheezes. She has no rales.  Abdominal: Soft. Bowel sounds are normal. She exhibits no mass. There is no tenderness. There is no guarding.  Musculoskeletal: Normal range of motion. She exhibits no edema.  Lymphadenopathy:    She has no cervical  adenopathy.  Neurological: She is alert. She has normal reflexes.  Skin: Skin is warm and dry. She is not diaphoretic.  Psychiatric: Mood and affect normal.  Nursing note and vitals reviewed.     Assessment & Plan  Problem List Items Addressed This Visit      Digestive   Esophageal reflux   Relevant Medications   omeprazole (PRILOSEC) 20 MG capsule   Irritable bowel syndrome with diarrhea   Relevant Medications   cholestyramine light (PREVALITE) 4 GM/DOSE powder   omeprazole (PRILOSEC) 20 MG capsule     Endocrine   Type 2 diabetes mellitus without complication, without long-term current use of insulin (HCC) -  Primary   Relevant Medications   glipiZIDE (GLUCOTROL) 5 MG tablet   metFORMIN (GLUCOPHAGE XR) 500 MG 24 hr tablet   Other Relevant Orders   Renal Function Panel   Hemoglobin A1c   Adult hypothyroidism   Relevant Medications   levothyroxine (SYNTHROID, LEVOTHROID) 25 MCG tablet   Other Relevant Orders   TSH     Other   Recurrent major depressive disorder, in partial remission ([place])    Other Visit Diagnoses    Need for hepatitis C screening test       Relevant Orders   Hepatitis C antibody   Puncture wound of right hand, foreign body presence unspecified, initial encounter       ?sporotrichosis   Relevant Medications   cephALEXin (KEFLEX) 500 MG capsule   mupirocin ointment (BACTROBAN) 2 %   Other Relevant Orders   Ambulatory referral to Dermatology      Meds ordered this encounter  Medications  . cholestyramine light (PREVALITE) 4 GM/DOSE powder    Sig: Take 1 packet (4 g total) by mouth daily.    Dispense:  378 g    Refill:  11    Pt has been getting a 378 Gram can and taking 1 tsp every day as directed. Please fill as been getting- Thank you  . glipiZIDE (GLUCOTROL) 5 MG tablet    Sig: Take 1 tablet (5 mg total) by mouth daily before breakfast.    Dispense:  90 tablet    Refill:  1  . levothyroxine (SYNTHROID, LEVOTHROID) 25 MCG tablet    Sig: Take 1 tablet (25 mcg total) by mouth daily.    Dispense:  90 tablet    Refill:  1  . metFORMIN (GLUCOPHAGE XR) 500 MG 24 hr tablet    Sig: Take 1 tablet (500 mg total) by mouth daily with breakfast.    Dispense:  90 tablet    Refill:  1  . omeprazole (PRILOSEC) 20 MG capsule    Sig: Take 1 capsule (20 mg total) by mouth as needed. otc    Dispense:  30 capsule    Refill:  6  . cephALEXin (KEFLEX) 500 MG capsule    Sig: Take 1 capsule (500 mg total) by mouth 4 (four) times daily.    Dispense:  30 capsule    Refill:  0  . mupirocin ointment (BACTROBAN) 2 %    Sig: Apply 1 application topically 2 (two) times daily.     Dispense:  22 g    Refill:  0      Dr. [facility]  01/06/18

## 2018-01-07 DIAGNOSIS — Z1159 Encounter for screening for other viral diseases: Secondary | ICD-10-CM | POA: Diagnosis not present

## 2018-01-07 DIAGNOSIS — E039 Hypothyroidism, unspecified: Secondary | ICD-10-CM | POA: Diagnosis not present

## 2018-01-07 DIAGNOSIS — E118 Type 2 diabetes mellitus with unspecified complications: Secondary | ICD-10-CM | POA: Diagnosis not present

## 2018-01-08 ENCOUNTER — Other Ambulatory Visit: Payer: Self-pay

## 2018-01-08 LAB — RENAL FUNCTION PANEL
Albumin: 4.6 g/dL (ref 3.5–4.8)
BUN/Creatinine Ratio: 21 (ref 12–28)
BUN: 12 mg/dL (ref 8–27)
CHLORIDE: 100 mmol/L (ref 96–106)
CO2: 21 mmol/L (ref 20–29)
Calcium: 9.5 mg/dL (ref 8.7–10.3)
Creatinine, Ser: 0.56 mg/dL — ABNORMAL LOW (ref 0.57–1.00)
GFR calc Af Amer: 108 mL/min/{1.73_m2} (ref >59)
GFR, EST NON AFRICAN AMERICAN: 93 mL/min/{1.73_m2} (ref >59)
GLUCOSE: 114 mg/dL — AB (ref 65–99)
PHOSPHORUS: 3.3 mg/dL (ref 2.5–4.5)
POTASSIUM: 4.2 mmol/L (ref 3.5–5.2)
Sodium: 140 mmol/L (ref 134–144)

## 2018-01-08 LAB — TSH: TSH: 2.97 u[IU]/mL (ref 0.450–4.500)

## 2018-01-08 LAB — HEMOGLOBIN A1C
ESTIMATED AVERAGE GLUCOSE: 214 mg/dL
Hgb A1c MFr Bld: 9.1 % — ABNORMAL HIGH (ref 4.8–5.6)

## 2018-01-08 LAB — HEPATITIS C ANTIBODY: Hep C Virus Ab: 0.2 s/co ratio (ref 0.0–0.9)

## 2018-01-25 DIAGNOSIS — L923 Foreign body granuloma of the skin and subcutaneous tissue: Secondary | ICD-10-CM | POA: Diagnosis not present

## 2018-02-08 DIAGNOSIS — L729 Follicular cyst of the skin and subcutaneous tissue, unspecified: Secondary | ICD-10-CM | POA: Diagnosis not present

## 2018-02-08 DIAGNOSIS — D485 Neoplasm of uncertain behavior of skin: Secondary | ICD-10-CM | POA: Diagnosis not present

## 2018-02-08 DIAGNOSIS — L814 Other melanin hyperpigmentation: Secondary | ICD-10-CM | POA: Diagnosis not present

## 2018-02-08 DIAGNOSIS — L570 Actinic keratosis: Secondary | ICD-10-CM | POA: Diagnosis not present

## 2018-02-08 DIAGNOSIS — L578 Other skin changes due to chronic exposure to nonionizing radiation: Secondary | ICD-10-CM | POA: Diagnosis not present

## 2018-04-26 ENCOUNTER — Other Ambulatory Visit: Payer: Self-pay | Admitting: Family Medicine

## 2018-04-26 DIAGNOSIS — Z1231 Encounter for screening mammogram for malignant neoplasm of breast: Secondary | ICD-10-CM

## 2018-05-05 ENCOUNTER — Ambulatory Visit
Admission: RE | Admit: 2018-05-05 | Discharge: 2018-05-05 | Disposition: A | Discharge status: Home / Self Care | Payer: Medicare Other | Source: Ambulatory Visit | Attending: Family Medicine | Admitting: Family Medicine

## 2018-05-05 DIAGNOSIS — Z1231 Encounter for screening mammogram for malignant neoplasm of breast: Secondary | ICD-10-CM | POA: Diagnosis not present

## 2018-07-16 ENCOUNTER — Other Ambulatory Visit: Payer: Self-pay

## 2018-07-19 ENCOUNTER — Other Ambulatory Visit: Payer: Self-pay | Admitting: Family Medicine

## 2018-07-19 DIAGNOSIS — E119 Type 2 diabetes mellitus without complications: Secondary | ICD-10-CM

## 2018-07-29 DIAGNOSIS — H6123 Impacted cerumen, bilateral: Secondary | ICD-10-CM | POA: Diagnosis not present

## 2018-07-29 DIAGNOSIS — H9190 Unspecified hearing loss, unspecified ear: Secondary | ICD-10-CM | POA: Diagnosis not present

## 2018-08-23 ENCOUNTER — Ambulatory Visit (INDEPENDENT_AMBULATORY_CARE_PROVIDER_SITE_OTHER): Payer: Medicare Other | Admitting: Family Medicine

## 2018-08-23 ENCOUNTER — Encounter: Payer: Self-pay | Admitting: Family Medicine

## 2018-08-23 VITALS — BP 138/76 | HR 64 | Ht 66.0 in | Wt 149.0 lb

## 2018-08-23 DIAGNOSIS — J0100 Acute maxillary sinusitis, unspecified: Secondary | ICD-10-CM | POA: Diagnosis not present

## 2018-08-23 DIAGNOSIS — E119 Type 2 diabetes mellitus without complications: Secondary | ICD-10-CM | POA: Diagnosis not present

## 2018-08-23 DIAGNOSIS — J301 Allergic rhinitis due to pollen: Secondary | ICD-10-CM | POA: Diagnosis not present

## 2018-08-23 DIAGNOSIS — E039 Hypothyroidism, unspecified: Secondary | ICD-10-CM

## 2018-08-23 DIAGNOSIS — K580 Irritable bowel syndrome with diarrhea: Secondary | ICD-10-CM

## 2018-08-23 MED ORDER — CHOLESTYRAMINE LIGHT 4 GM/DOSE PO POWD: 4.0000 g | Freq: Every day | ORAL | 11 refills | Status: DC

## 2018-08-23 MED ORDER — METFORMIN HCL ER 500 MG PO TB24: 500.0000 mg | ORAL_TABLET | Freq: Every day | ORAL | 1 refills | Status: DC

## 2018-08-23 MED ORDER — LEVOTHYROXINE SODIUM 25 MCG PO TABS: 25.0000 ug | ORAL_TABLET | Freq: Every day | ORAL | 1 refills | Status: DC

## 2018-08-23 MED ORDER — DOXYCYCLINE HYCLATE 100 MG PO TABS: 100.0000 mg | ORAL_TABLET | Freq: Two times a day (BID) | ORAL | 0 refills | Status: DC

## 2018-08-23 MED ORDER — TRIAMCINOLONE ACETONIDE 55 MCG/ACT NA AERO: 2.0000 | INHALATION_SPRAY | Freq: Every day | NASAL | 12 refills | Status: DC

## 2018-08-23 MED ORDER — GLIPIZIDE 5 MG PO TABS: ORAL_TABLET | ORAL | 1 refills | Status: DC

## 2018-08-23 NOTE — Assessment & Plan Note (Signed)
Chronic Controlled per fasting values. Continue metformen 500mg  xr daily and glipizide 5 mg daily. Will check a1c ANDLIPID PANEL.

## 2018-08-23 NOTE — Assessment & Plan Note (Signed)
Recurrent. Resume Nasocort AQ.

## 2018-08-23 NOTE — Assessment & Plan Note (Signed)
Chronic Recurrent. Cont cholecystyramine 4 gm daily.

## 2018-08-23 NOTE — Assessment & Plan Note (Signed)
Chronic Controlled will check TSH and adjust levothyroxine accordingly.

## 2018-08-23 NOTE — Progress Notes (Signed)
Name: Rebecca Robbins   MRN: [id number]    DOB: May 14, 1946   Date:08/23/2018       Progress Note  Subjective  Chief Complaint  Chief Complaint  Patient presents with  . Diabetes  . Hypothyroidism    Diabetes  She presents for her follow-up diabetic visit. She has type 2 diabetes mellitus. Her disease course has been fluctuating. There are no hypoglycemic associated symptoms. Pertinent negatives for hypoglycemia include no dizziness, headaches, nervousness/anxiousness or tremors. There are no diabetic associated symptoms. Pertinent negatives for diabetes include no blurred vision, no chest pain, no fatigue, no polydipsia, no visual change and no weight loss. There are no hypoglycemic complications. Symptoms are stable. There are no diabetic complications. Risk factors for coronary artery disease include dyslipidemia and diabetes mellitus. Current diabetic treatment includes oral agent (dual therapy). She is compliant with treatment some of the time. Her weight is stable. She is following a generally healthy diet. Meal planning includes avoidance of concentrated sweets. She participates in exercise intermittently. Her home blood glucose trend is increasing rapidly. Her breakfast blood glucose is taken between 8-9 am. Her breakfast blood glucose range is generally 130-140 mg/dl. An ACE inhibitor/angiotensin II receptor blocker is being taken. She does not see a podiatrist.Eye exam is not current.  Hyperlipidemia  This is a chronic problem. The current episode started more than 1 year ago. Recent lipid tests were reviewed and are high. Exacerbating diseases include hypothyroidism. She has no history of chronic renal disease, diabetes, liver disease, obesity or nephrotic syndrome. Associated symptoms include shortness of breath. Pertinent negatives include no chest pain, focal weakness or myalgias. Current antihyperlipidemic treatment includes statins. The current treatment provides moderate improvement of  lipids. There are no compliance problems.  Risk factors for coronary artery disease include diabetes mellitus and dyslipidemia.  Sinusitis  This is a chronic problem. The current episode started more than 1 year ago. The problem has been waxing and waning since onset. There has been no fever. The pain is mild. Associated symptoms include coughing, ear pain, shortness of breath, sinus pressure, sneezing, a sore throat and swollen glands. Pertinent negatives include no chills, congestion, diaphoresis, headaches, hoarse voice or neck pain. The treatment provided mild relief.  Thyroid Problem  Presents for follow-up visit. Patient reports no anxiety, cold intolerance, constipation, depressed mood, diaphoresis, diarrhea, dry skin, fatigue, hair loss, heat intolerance, hoarse voice, leg swelling, menstrual problem, nail problem, palpitations, tremors, visual change, weight gain or weight loss. The symptoms have been stable. Her past medical history is significant for hyperlipidemia. There is no history of diabetes.    Adult hypothyroidism Chronic Controlled will check TSH and adjust levothyroxine accordingly.   Type 2 diabetes mellitus without complication, without long-term current use of insulin (HCC) Chronic Controlled per fasting values. Continue metformen 500mg  xr daily and glipizide 5 mg daily. Will check a1c ANDLIPID PANEL.  Allergic rhinitis due to pollen Recurrent. Resume Nasocort AQ.   Past Medical History:  Diagnosis Date  . Diabetes mellitus without complication ([place])   . GERD (gastroesophageal reflux disease)   . Irritable bowel syndrome   . Thyroid disease     Past Surgical History:  Procedure Laterality Date  . BREAST BIOPSY Right 2004   neg  . CHOLECYSTECTOMY    . COLONOSCOPY  2015   repeat 10 yrs- Dr Allen Norris    Family History  Problem Relation Age of Onset  . Cancer Mother   . Diabetes Mother   . Cancer Father   .  Heart disease Maternal Grandmother   . Heart disease  Paternal Grandmother   . Breast cancer Neg Hx     Social History   Socioeconomic History  . Marital status: Married    Spouse name: Not on file  . Number of children: Not on file  . Years of education: Not on file  . Highest education level: Not on file  Occupational History  . Not on file  Social Needs  . Financial resource strain: Not on file  . Food insecurity:    Worry: Not on file    Inability: Not on file  . Transportation needs:    Medical: Not on file    Non-medical: Not on file  Tobacco Use  . Smoking status: Never Smoker  . Smokeless tobacco: Never Used  Substance and Sexual Activity  . Alcohol use: Yes    Alcohol/week: 0.0 standard drinks  . Drug use: No  . Sexual activity: Yes  Lifestyle  . Physical activity:    Days per week: Not on file    Minutes per session: Not on file  . Stress: Not on file  Relationships  . Social connections:    Talks on phone: Not on file    Gets together: Not on file    Attends religious service: Not on file    Active member of club or organization: Not on file    Attends meetings of clubs or organizations: Not on file    Relationship status: Not on file  . Intimate partner violence:    Fear of current or ex partner: Not on file    Emotionally abused: Not on file    Physically abused: Not on file    Forced sexual activity: Not on file  Other Topics Concern  . Not on file  Social History Narrative  . Not on file    Allergies  Allergen Reactions  . Penicillins Other (See Comments)    Does not work for patient  . Azithromycin Rash  . Hydrocodone-Acetaminophen Nausea Only and Rash    Outpatient Medications Prior to Visit  Medication Sig Dispense Refill  . omeprazole (PRILOSEC) 20 MG capsule Take 1 capsule (20 mg total) by mouth as needed. otc 30 capsule 6  . cholestyramine light (PREVALITE) 4 GM/DOSE powder Take 1 packet (4 g total) by mouth daily. 378 g 11  . glipiZIDE (GLUCOTROL) 5 MG tablet TAKE 1 TABLET BY MOUTH  ONCE DAILY BEFORE BREAKFAST 30 tablet 0  . levothyroxine (SYNTHROID, LEVOTHROID) 25 MCG tablet Take 1 tablet (25 mcg total) by mouth daily. 90 tablet 1  . metFORMIN (GLUCOPHAGE XR) 500 MG 24 hr tablet Take 1 tablet (500 mg total) by mouth daily with breakfast. 90 tablet 1  . cephALEXin (KEFLEX) 500 MG capsule Take 1 capsule (500 mg total) by mouth 4 (four) times daily. 30 capsule 0  . etodolac (LODINE XL) 400 MG 24 hr tablet Take 1 tablet (400 mg total) by mouth daily. (Patient not taking: Reported on 03/02/2017) 90 tablet 1  . mupirocin ointment (BACTROBAN) 2 % Apply 1 application topically 2 (two) times daily. 22 g 0   No facility-administered medications prior to visit.     Review of Systems  Constitutional: Negative for chills, diaphoresis, fatigue, fever, malaise/fatigue, weight gain and weight loss.  HENT: Positive for ear pain, sinus pressure, sneezing and sore throat. Negative for congestion, ear discharge and hoarse voice.   Eyes: Negative for blurred vision.  Respiratory: Positive for cough and shortness of breath.  Negative for sputum production and wheezing.   Cardiovascular: Negative for chest pain, palpitations and leg swelling.  Gastrointestinal: Negative for abdominal pain, blood in stool, constipation, diarrhea, heartburn, melena and nausea.  Genitourinary: Negative for dysuria, frequency, hematuria, menstrual problem and urgency.  Musculoskeletal: Negative for back pain, joint pain, myalgias and neck pain.  Skin: Negative for rash.  Neurological: Negative for dizziness, tingling, tremors, sensory change, focal weakness and headaches.  Endo/Heme/Allergies: Negative for environmental allergies, cold intolerance, heat intolerance and polydipsia. Does not bruise/bleed easily.  Psychiatric/Behavioral: Negative for depression and suicidal ideas. The patient is not nervous/anxious and does not have insomnia.      Objective  Vitals:   08/23/18 1035  BP: 138/76  Pulse: 64   Weight: 149 lb (67.6 kg)  Height: 5\' 6"  (1.676 m)    Physical Exam  Constitutional: She is oriented to person, place, and time. She appears well-developed and well-nourished. No distress.  HENT:  Head: Normocephalic and atraumatic.  Right Ear: Hearing, tympanic membrane, external ear and ear canal normal.  Left Ear: Hearing, tympanic membrane, external ear and ear canal normal.  Nose: No mucosal edema. Right sinus exhibits maxillary sinus tenderness and frontal sinus tenderness. Left sinus exhibits maxillary sinus tenderness and frontal sinus tenderness.  Mouth/Throat: Uvula is midline, oropharynx is clear and moist and mucous membranes are normal.  Eyes: Pupils are equal, round, and reactive to light. Conjunctivae and EOM are normal. Lids are everted and swept, no foreign bodies found. Right eye exhibits no discharge. Left eye exhibits no discharge and no hordeolum. No foreign body present in the left eye. Right conjunctiva is not injected. Left conjunctiva is not injected. No scleral icterus.  Neck: Normal range of motion. Neck supple. No JVD present. No tracheal deviation present. No thyromegaly present.  Cardiovascular: Normal rate, regular rhythm, S1 normal, S2 normal, normal heart sounds and intact distal pulses. Exam reveals no gallop, no S3, no S4 and no friction rub.  No murmur heard.  No systolic murmur is present.  No diastolic murmur is present. Pulmonary/Chest: Effort normal and breath sounds normal. No stridor. No respiratory distress. She has no decreased breath sounds. She has no wheezes. She has no rhonchi. She has no rales.  Abdominal: Soft. Bowel sounds are normal. She exhibits no mass. There is no hepatosplenomegaly. There is no tenderness. There is no rebound and no guarding.  Musculoskeletal: Normal range of motion. She exhibits no edema or tenderness.  Lymphadenopathy:       Head (right side): No submental and no submandibular adenopathy present.       Head (left  side): No submental and no submandibular adenopathy present.    She has no cervical adenopathy.  Neurological: She is alert and oriented to person, place, and time. She has normal strength and normal reflexes. She displays normal reflexes. No cranial nerve deficit.  Skin: Skin is warm and dry. No rash noted. She is not diaphoretic.  Psychiatric: She has a normal mood and affect. Her mood appears not anxious. She does not exhibit a depressed mood.  Nursing note and vitals reviewed.     Assessment & Plan  Problem List Items Addressed This Visit      Respiratory   Allergic rhinitis due to pollen    Recurrent. Resume Nasocort AQ.      Relevant Medications   triamcinolone (NASACORT) 55 MCG/ACT AERO nasal inhaler     Digestive   Irritable bowel syndrome with diarrhea   Relevant Medications   cholestyramine  light (PREVALITE) 4 GM/DOSE powder     Endocrine   Type 2 diabetes mellitus without complication, without long-term current use of insulin ([place]) - Primary    Chronic Controlled per fasting values. Continue metformen 500mg  xr daily and glipizide 5 mg daily. Will check a1c ANDLIPID PANEL.      Relevant Medications   glipiZIDE (GLUCOTROL) 5 MG tablet   metFORMIN (GLUCOPHAGE XR) 500 MG 24 hr tablet   Other Relevant Orders   Hemoglobin A1c   Lipid panel   Adult hypothyroidism    Chronic Controlled will check TSH and adjust levothyroxine accordingly.       Relevant Medications   levothyroxine (SYNTHROID, LEVOTHROID) 25 MCG tablet    Other Visit Diagnoses    Acute maxillary sinusitis, recurrence not specified       Acute. HX/exam c/w maxillary sdinusitis . Will initiate doxycycline 100 mg bid   Relevant Medications   doxycycline (VIBRA-TABS) 100 MG tablet   triamcinolone (NASACORT) 55 MCG/ACT AERO nasal inhaler      Meds ordered this encounter  Medications  . levothyroxine (SYNTHROID, LEVOTHROID) 25 MCG tablet    Sig: Take 1 tablet (25 mcg total) by mouth daily.     Dispense:  90 tablet    Refill:  1  . cholestyramine light (PREVALITE) 4 GM/DOSE powder    Sig: Take 1 packet (4 g total) by mouth daily.    Dispense:  378 g    Refill:  11    Pt has been getting a 378 Gram can and taking 1 tsp every day as directed. Please fill as been getting- Thank you  . glipiZIDE (GLUCOTROL) 5 MG tablet    Sig: TAKE 1 TABLET BY MOUTH ONCE DAILY BEFORE BREAKFAST    Dispense:  90 tablet    Refill:  1  . metFORMIN (GLUCOPHAGE XR) 500 MG 24 hr tablet    Sig: Take 1 tablet (500 mg total) by mouth daily with breakfast.    Dispense:  90 tablet    Refill:  1  . doxycycline (VIBRA-TABS) 100 MG tablet    Sig: Take 1 tablet (100 mg total) by mouth 2 (two) times daily.    Dispense:  20 tablet    Refill:  0  . triamcinolone (NASACORT) 55 MCG/ACT AERO nasal inhaler    Sig: Place 2 sprays into the nose daily.    Dispense:  1 Inhaler    Refill:  12      Dr. Mac[facility]  08/23/18

## 2018-08-24 LAB — LIPID PANEL
Chol/HDL Ratio: 3.1 ratio (ref 0.0–4.4)
Cholesterol, Total: 238 mg/dL — ABNORMAL HIGH (ref 100–199)
HDL: 76 mg/dL (ref >39)
LDL CALC: 133 mg/dL — AB (ref 0–99)
TRIGLYCERIDES: 144 mg/dL (ref 0–149)
VLDL Cholesterol Cal: 29 mg/dL (ref 5–40)

## 2018-08-24 LAB — HEMOGLOBIN A1C
Est. average glucose Bld gHb Est-mCnc: 143 mg/dL
HEMOGLOBIN A1C: 6.6 % — AB (ref 4.8–5.6)

## 2018-10-25 ENCOUNTER — Ambulatory Visit (INDEPENDENT_AMBULATORY_CARE_PROVIDER_SITE_OTHER): Payer: Medicare Other

## 2018-10-25 DIAGNOSIS — Z23 Encounter for immunization: Secondary | ICD-10-CM

## 2018-12-24 DIAGNOSIS — H9190 Unspecified hearing loss, unspecified ear: Secondary | ICD-10-CM | POA: Diagnosis not present

## 2018-12-24 DIAGNOSIS — H6123 Impacted cerumen, bilateral: Secondary | ICD-10-CM | POA: Diagnosis not present

## 2019-02-28 ENCOUNTER — Other Ambulatory Visit: Payer: Self-pay

## 2019-02-28 DIAGNOSIS — E119 Type 2 diabetes mellitus without complications: Secondary | ICD-10-CM

## 2019-02-28 DIAGNOSIS — E039 Hypothyroidism, unspecified: Secondary | ICD-10-CM

## 2019-02-28 DIAGNOSIS — J0100 Acute maxillary sinusitis, unspecified: Secondary | ICD-10-CM

## 2019-02-28 MED ORDER — METFORMIN HCL ER 500 MG PO TB24: 500.0000 mg | ORAL_TABLET | Freq: Every day | ORAL | 0 refills | Status: DC

## 2019-02-28 MED ORDER — GLIPIZIDE 5 MG PO TABS: ORAL_TABLET | ORAL | 0 refills | Status: DC

## 2019-02-28 MED ORDER — LEVOTHYROXINE SODIUM 25 MCG PO TABS: 25.0000 ug | ORAL_TABLET | Freq: Every day | ORAL | 0 refills | Status: DC

## 2019-04-18 ENCOUNTER — Other Ambulatory Visit: Payer: Self-pay

## 2019-04-18 ENCOUNTER — Encounter: Payer: Self-pay | Admitting: Family Medicine

## 2019-04-18 ENCOUNTER — Ambulatory Visit (INDEPENDENT_AMBULATORY_CARE_PROVIDER_SITE_OTHER): Payer: Medicare Other | Admitting: Family Medicine

## 2019-04-18 VITALS — BP 130/70 | HR 86 | Ht 66.0 in | Wt 150.0 lb

## 2019-04-18 DIAGNOSIS — S93504A Unspecified sprain of right lesser toe(s), initial encounter: Secondary | ICD-10-CM | POA: Diagnosis not present

## 2019-04-18 DIAGNOSIS — R69 Illness, unspecified: Secondary | ICD-10-CM

## 2019-04-18 DIAGNOSIS — E119 Type 2 diabetes mellitus without complications: Secondary | ICD-10-CM | POA: Diagnosis not present

## 2019-04-18 DIAGNOSIS — K580 Irritable bowel syndrome with diarrhea: Secondary | ICD-10-CM | POA: Diagnosis not present

## 2019-04-18 DIAGNOSIS — E039 Hypothyroidism, unspecified: Secondary | ICD-10-CM | POA: Diagnosis not present

## 2019-04-18 MED ORDER — METFORMIN HCL ER 500 MG PO TB24: 500.0000 mg | ORAL_TABLET | Freq: Every day | ORAL | 0 refills | Status: DC

## 2019-04-18 MED ORDER — GLIPIZIDE 5 MG PO TABS: ORAL_TABLET | ORAL | 0 refills | Status: DC

## 2019-04-18 MED ORDER — LEVOTHYROXINE SODIUM 25 MCG PO TABS: 25.0000 ug | ORAL_TABLET | Freq: Every day | ORAL | 0 refills | Status: DC

## 2019-04-18 MED ORDER — TRAMADOL HCL 50 MG PO TABS: 50.0000 mg | ORAL_TABLET | Freq: Three times a day (TID) | ORAL | 0 refills | Status: AC | PRN

## 2019-04-18 MED ORDER — CHOLESTYRAMINE LIGHT 4 GM/DOSE PO POWD: 4.0000 g | Freq: Every day | ORAL | 11 refills | Status: DC

## 2019-04-18 NOTE — Progress Notes (Signed)
Date:  04/18/2019   Name:  Rebecca Robbins   DOB:  Apr 13, 1946   MRN:  [id number]   Chief Complaint: Hyperlipidemia; Diabetes; and Hypothyroidism  Hyperlipidemia  This is a chronic problem. The current episode started more than 1 year ago. The problem is controlled. Recent lipid tests were reviewed and are normal. She has no history of chronic renal disease, diabetes, hypothyroidism, liver disease, obesity or nephrotic syndrome. There are no known factors aggravating her hyperlipidemia. Pertinent negatives include no chest pain, focal sensory loss, focal weakness, leg pain, myalgias or shortness of breath. She is currently on no antihyperlipidemic treatment. The current treatment provides moderate improvement of lipids. There are no compliance problems.   Diabetes  She presents for her follow-up diabetic visit. She has type 2 diabetes mellitus. Her disease course has been stable. There are no hypoglycemic associated symptoms. Pertinent negatives for hypoglycemia include no dizziness, headaches, nervousness/anxiousness or tremors. Pertinent negatives for diabetes include no blurred vision, no chest pain, no fatigue, no foot paresthesias, no foot ulcerations, no polydipsia, no polyphagia, no polyuria, no visual change, no weakness and no weight loss. There are no hypoglycemic complications. Symptoms are stable. There are no diabetic complications. There are no known risk factors for coronary artery disease. Current diabetic treatment includes oral agent (dual therapy). She is compliant with treatment some of the time. Her weight is stable. She is following a generally healthy diet. An ACE inhibitor/angiotensin II receptor blocker is not being taken.  Thyroid Problem  Presents for follow-up visit. Patient reports no anxiety, cold intolerance, constipation, depressed mood, diaphoresis, diarrhea, dry skin, fatigue, hair loss, heat intolerance, hoarse voice, leg swelling, menstrual problem, nail problem,  palpitations, tremors, visual change, weight gain or weight loss. The symptoms have been stable. Her past medical history is significant for hyperlipidemia. There is no history of diabetes.  Foot Injury   The incident occurred 2 days ago. The incident occurred at home. The injury mechanism was a direct blow (hit a clothes hamper). The pain is present in the right toes. The quality of the pain is described as aching. The pain is at a severity of 4/10. The pain is mild. The pain has been constant since onset. Pertinent negatives include no inability to bear weight, loss of motion, loss of sensation, muscle weakness, numbness or tingling. She reports no foreign bodies present. The symptoms are aggravated by palpation. The treatment provided moderate relief.    Review of Systems  Constitutional: Negative.  Negative for chills, diaphoresis, fatigue, fever, unexpected weight change, weight gain and weight loss.  HENT: Negative for congestion, ear discharge, ear pain, hoarse voice, rhinorrhea, sinus pressure, sneezing and sore throat.   Eyes: Negative for blurred vision, photophobia, pain, discharge, redness and itching.  Respiratory: Negative for cough, shortness of breath, wheezing and stridor.   Cardiovascular: Negative for chest pain and palpitations.  Gastrointestinal: Negative for abdominal pain, blood in stool, constipation, diarrhea, nausea and vomiting.  Endocrine: Negative for cold intolerance, heat intolerance, polydipsia, polyphagia and polyuria.  Genitourinary: Negative for dysuria, flank pain, frequency, hematuria, menstrual problem, pelvic pain, urgency, vaginal bleeding and vaginal discharge.  Musculoskeletal: Negative for arthralgias, back pain and myalgias.  Skin: Negative for rash.  Allergic/Immunologic: Negative for environmental allergies and food allergies.  Neurological: Negative for dizziness, tingling, tremors, focal weakness, weakness, light-headedness, numbness and headaches.   Hematological: Negative for adenopathy. Does not bruise/bleed easily.  Psychiatric/Behavioral: Negative for dysphoric mood. The patient is not nervous/anxious.  Patient Active Problem List   Diagnosis Date Noted  . Recurrent major depressive disorder, in partial remission ([place]) 01/06/2018  . Abnormal mammogram 03/02/2017  . 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 Irritable bowel syndrome with diarrhea 03/02/2017  . Type 2 diabetes mellitus without complication, without long-term current use of insulin ([place]) 08/28/2016  . Esophageal reflux 08/28/2016  . Adult hypothyroidism 08/28/2016  . Allergic rhinitis due to pollen 08/28/2016  . Cephalalgia 11/23/2014    Allergies  Allergen Reactions  . Penicillins Other (See Comments)    Does not work for patient  . Azithromycin Rash  . Hydrocodone-Acetaminophen Nausea Only and Rash    Past Surgical History:  Procedure Laterality Date  . BREAST BIOPSY Right 2004   neg  . CHOLECYSTECTOMY    . COLONOSCOPY  2015   repeat 10 yrs- Dr Allen Norris    Social History   Tobacco Use  . Smoking status: Never Smoker  . Smokeless tobacco: Never Used  Substance Use Topics  . Alcohol use: Yes    Alcohol/week: 0.0 standard drinks  . Drug use: No     Medication list has been reviewed and updated.  Current Meds  Medication Sig  . cholestyramine light (PREVALITE) 4 GM/DOSE powder Take 1 packet (4 g total) by mouth daily.  [place] Kitchen glipiZIDE (GLUCOTROL) 5 MG tablet TAKE 1 TABLET BY MOUTH ONCE DAILY BEFORE BREAKFAST  . levothyroxine (SYNTHROID, LEVOTHROID) 25 MCG tablet Take 1 tablet (25 mcg total) by mouth daily.  . metFORMIN (GLUCOPHAGE XR) 500 MG 24 hr tablet Take 1 tablet (500 mg total) by mouth daily with breakfast.  . omeprazole (PRILOSEC) 20 MG capsule Take 1 capsule (20 mg total) by mouth as needed. otc  . triamcinolone (NASACORT) 55 MCG/ACT AERO nasal inhaler Place 2 sprays into the nose daily.    PHQ 2/9 Scores 08/23/2018 01/30/2017 01/15/2016  PHQ  - 2 Score 0 0 1    BP Readings from Last 3 Encounters:  04/18/19 130/70  08/23/18 138/76  01/06/18 138/88    Physical Exam Vitals signs and nursing note reviewed.  Constitutional:      General: She is not in acute distress.    Appearance: She is not diaphoretic.  HENT:     Head: Normocephalic and atraumatic.     Right Ear: Tympanic membrane, ear canal and external ear normal.     Left Ear: Tympanic membrane, ear canal and external ear normal.     Nose: Nose normal. No congestion or rhinorrhea.     Mouth/Throat:     Mouth: Mucous membranes are moist.     Pharynx: Oropharynx is clear. No posterior oropharyngeal erythema.  Eyes:     General:        Right eye: No discharge.        Left eye: No discharge.     Conjunctiva/sclera: Conjunctivae normal.     Pupils: Pupils are equal, round, and reactive to light.  Neck:     Musculoskeletal: Normal range of motion and neck supple.     Thyroid: No thyromegaly.     Vascular: No JVD.  Cardiovascular:     Rate and Rhythm: Normal rate and regular rhythm.     Pulses: Normal pulses.     Heart sounds: Normal heart sounds. No murmur. No friction rub. No gallop.   Pulmonary:     Effort: Pulmonary effort is normal.     Breath sounds: Normal breath sounds. No wheezing or rhonchi.  Chest:     Chest wall: No  tenderness.  Abdominal:     General: Bowel sounds are normal.     Palpations: Abdomen is soft. There is no mass.     Tenderness: There is no abdominal tenderness. There is no guarding or rebound.  Musculoskeletal: Normal range of motion.     Right foot: Tenderness present.       Feet:     Comments: Tender right proximal phalanx 5th toe  Lymphadenopathy:     Cervical: No cervical adenopathy.  Skin:    General: Skin is warm and dry.  Neurological:     Mental Status: She is alert.     Deep Tendon Reflexes: Reflexes are normal and symmetric.     Wt Readings from Last 3 Encounters:  04/18/19 150 lb (68 kg)  08/23/18 149 lb (67.6  kg)  01/06/18 153 lb (69.4 kg)    BP 130/70   Pulse 86   Ht 5\' 6"  (1.676 m)   Wt 150 lb (68 kg)   BMI 24.21 kg/m   Assessment and Plan:  1. Adult hypothyroidism Chronic.  Controlled.  Continue levothyroxine 25 mcg once a day and will check TSH in the future.  Review of her last 4 TSH over the past 4 years are within normal range. - levothyroxine (SYNTHROID) 25 MCG tablet; Take 1 tablet (25 mcg total) by mouth daily.  Dispense: 30 tablet; Refill: 0  2. Irritable bowel syndrome with diarrhea Due to previous surgery patient has irritable bowel syndrome with diarrhea she controls this with cholecystotomy 1 packet paired with necessary water as needed daily - cholestyramine light (PREVALITE) 4 GM/DOSE powder; Take 1 packet (4 g total) by mouth daily.  Dispense: 378 g; Refill: 11  3. Type 2 diabetes mellitus without complication, without long-term current use of insulin (HCC) Chronic.  Controlled.  Continue metformin 500 mg twice a day and glipizide 5 mg once daily.  Will check microalbuminuria renal function panel and A1c. - Microalbumin / creatinine urine ratio - Renal function panel - Hemoglobin A1c - metFORMIN (GLUCOPHAGE XR) 500 MG 24 hr tablet; Take 1 tablet (500 mg total) by mouth daily with breakfast.  Dispense: 30 tablet; Refill: 0 - glipiZIDE (GLUCOTROL) 5 MG tablet; TAKE 1 TABLET BY MOUTH ONCE DAILY BEFORE BREAKFAST  Dispense: 30 tablet; Refill: 0 Patient sustained an injury to the fifth toe right foot 4. Sprain of lesser toe of right foot, initial encounter She sustained an injury to the fifth toe right foot on Thursday.  Patient continues to have pain and has buddy taped over the weekend.  There is tenderness of the proximal phalanx with minimal swelling and ecchymosis.  There is likely tear of a collateral ligament versus a possible nondisplaced fracture patient will continue current buddy taping and tramadol was provided as needed - traMADol (ULTRAM) 50 MG tablet; Take 1  tablet (50 mg total) by mouth every 8 (eight) hours as needed for up to 5 days.  Dispense: 15 tablet; Refill: 0  5. Taking medication for chronic disease Patient is currently on a cholesterol-lowering agent will check hepatic panel to find no hepatotoxicity. - Hepatic Function Panel (6)

## 2019-04-19 LAB — HEMOGLOBIN A1C
Est. average glucose Bld gHb Est-mCnc: 146 mg/dL
Hgb A1c MFr Bld: 6.7 % — ABNORMAL HIGH (ref 4.8–5.6)

## 2019-04-19 LAB — MICROALBUMIN / CREATININE URINE RATIO
Creatinine, Urine: 33.7 mg/dL
Microalb/Creat Ratio: 9 mg/g creat (ref 0–29)
Microalbumin, Urine: 3.2 ug/mL

## 2019-04-19 LAB — RENAL FUNCTION PANEL
Albumin: 5.1 g/dL — ABNORMAL HIGH (ref 3.7–4.7)
BUN/Creatinine Ratio: 13 (ref 12–28)
BUN: 10 mg/dL (ref 8–27)
CO2: 21 mmol/L (ref 20–29)
Calcium: 10.4 mg/dL — ABNORMAL HIGH (ref 8.7–10.3)
Chloride: 101 mmol/L (ref 96–106)
Creatinine, Ser: 0.76 mg/dL (ref 0.57–1.00)
GFR calc Af Amer: 90 mL/min/{1.73_m2} (ref >59)
GFR calc non Af Amer: 78 mL/min/{1.73_m2} (ref >59)
Glucose: 93 mg/dL (ref 65–99)
Phosphorus: 3.8 mg/dL (ref 3.0–4.3)
Potassium: 4.7 mmol/L (ref 3.5–5.2)
Sodium: 140 mmol/L (ref 134–144)

## 2019-04-19 LAB — HEPATIC FUNCTION PANEL (6)
ALT: 23 IU/L (ref 0–32)
AST: 26 IU/L (ref 0–40)
Alkaline Phosphatase: 91 IU/L (ref 39–117)
Bilirubin Total: 0.6 mg/dL (ref 0.0–1.2)
Bilirubin, Direct: 0.16 mg/dL (ref 0.00–0.40)

## 2019-04-21 ENCOUNTER — Other Ambulatory Visit: Payer: Self-pay

## 2019-04-21 DIAGNOSIS — E039 Hypothyroidism, unspecified: Secondary | ICD-10-CM

## 2019-04-21 DIAGNOSIS — E119 Type 2 diabetes mellitus without complications: Secondary | ICD-10-CM

## 2019-04-21 MED ORDER — GLIPIZIDE 5 MG PO TABS: ORAL_TABLET | ORAL | 1 refills | Status: DC

## 2019-04-21 MED ORDER — LEVOTHYROXINE SODIUM 25 MCG PO TABS: 25.0000 ug | ORAL_TABLET | Freq: Every day | ORAL | 1 refills | Status: DC

## 2019-04-21 MED ORDER — METFORMIN HCL ER 500 MG PO TB24: 500.0000 mg | ORAL_TABLET | Freq: Every day | ORAL | 1 refills | Status: DC

## 2019-09-14 DIAGNOSIS — H60549 Acute eczematoid otitis externa, unspecified ear: Secondary | ICD-10-CM | POA: Diagnosis not present

## 2019-09-14 DIAGNOSIS — H6123 Impacted cerumen, bilateral: Secondary | ICD-10-CM | POA: Diagnosis not present

## 2019-09-27 DIAGNOSIS — Z23 Encounter for immunization: Secondary | ICD-10-CM | POA: Diagnosis not present

## 2019-10-14 ENCOUNTER — Other Ambulatory Visit: Payer: Self-pay | Admitting: Family Medicine

## 2019-10-14 DIAGNOSIS — Z1231 Encounter for screening mammogram for malignant neoplasm of breast: Secondary | ICD-10-CM

## 2019-11-04 ENCOUNTER — Other Ambulatory Visit: Payer: Self-pay

## 2019-11-30 ENCOUNTER — Encounter: Payer: Self-pay | Admitting: Family Medicine

## 2019-11-30 ENCOUNTER — Other Ambulatory Visit: Payer: Self-pay

## 2019-11-30 ENCOUNTER — Ambulatory Visit (INDEPENDENT_AMBULATORY_CARE_PROVIDER_SITE_OTHER): Payer: Medicare Other | Admitting: Family Medicine

## 2019-11-30 VITALS — BP 124/68 | HR 88 | Ht 66.0 in | Wt 161.0 lb

## 2019-11-30 DIAGNOSIS — N3090 Cystitis, unspecified without hematuria: Secondary | ICD-10-CM | POA: Diagnosis not present

## 2019-11-30 DIAGNOSIS — E7801 Familial hypercholesterolemia: Secondary | ICD-10-CM | POA: Diagnosis not present

## 2019-11-30 DIAGNOSIS — R1011 Right upper quadrant pain: Secondary | ICD-10-CM | POA: Diagnosis not present

## 2019-11-30 DIAGNOSIS — F3341 Major depressive disorder, recurrent, in partial remission: Secondary | ICD-10-CM | POA: Diagnosis not present

## 2019-11-30 DIAGNOSIS — K219 Gastro-esophageal reflux disease without esophagitis: Secondary | ICD-10-CM

## 2019-11-30 DIAGNOSIS — E039 Hypothyroidism, unspecified: Secondary | ICD-10-CM

## 2019-11-30 DIAGNOSIS — E119 Type 2 diabetes mellitus without complications: Secondary | ICD-10-CM

## 2019-11-30 DIAGNOSIS — K580 Irritable bowel syndrome with diarrhea: Secondary | ICD-10-CM | POA: Diagnosis not present

## 2019-11-30 LAB — POCT URINALYSIS DIPSTICK
Bilirubin, UA: NEGATIVE
Blood, UA: NEGATIVE
Glucose, UA: NEGATIVE
Ketones, UA: NEGATIVE
Nitrite, UA: NEGATIVE
Protein, UA: NEGATIVE
Spec Grav, UA: 1.01 (ref 1.010–1.025)
Urobilinogen, UA: 0.2 E.U./dL
pH, UA: 5 (ref 5.0–8.0)

## 2019-11-30 MED ORDER — ATORVASTATIN CALCIUM 10 MG PO TABS: 10.0000 mg | ORAL_TABLET | Freq: Every day | ORAL | 1 refills | Status: DC

## 2019-11-30 MED ORDER — METFORMIN HCL ER 500 MG PO TB24: 500.0000 mg | ORAL_TABLET | Freq: Every day | ORAL | 1 refills | Status: DC

## 2019-11-30 MED ORDER — NITROFURANTOIN MONOHYD MACRO 100 MG PO CAPS: 100.0000 mg | ORAL_CAPSULE | Freq: Two times a day (BID) | ORAL | 0 refills | Status: DC

## 2019-11-30 MED ORDER — GLIPIZIDE 5 MG PO TABS: ORAL_TABLET | ORAL | 1 refills | Status: DC

## 2019-11-30 MED ORDER — PANTOPRAZOLE SODIUM 40 MG PO TBEC: 40.0000 mg | DELAYED_RELEASE_TABLET | Freq: Every day | ORAL | 1 refills | Status: DC

## 2019-11-30 MED ORDER — CHOLESTYRAMINE LIGHT 4 GM/DOSE PO POWD: 4.0000 g | Freq: Every day | ORAL | 11 refills | Status: DC

## 2019-11-30 MED ORDER — LEVOTHYROXINE SODIUM 25 MCG PO TABS: 25.0000 ug | ORAL_TABLET | Freq: Every day | ORAL | 1 refills | Status: DC

## 2019-11-30 NOTE — Patient Instructions (Signed)

## 2019-11-30 NOTE — Progress Notes (Signed)
Date:  11/30/2019   Name:  Rebecca Robbins   DOB:  12/26/1945   MRN:  [id number]   Chief Complaint: Gastroesophageal Reflux, Diabetes, Hyperlipidemia, Hypothyroidism, and Urinary Tract Infection (some burning x 1 week)  Gastroesophageal Reflux She complains of nausea. She reports no abdominal pain, no belching, no chest pain, no choking, no coughing, no dysphagia, no early satiety, no globus sensation, no heartburn, no hoarse voice, no sore throat, no stridor or no wheezing. This is a chronic problem. The current episode started more than 1 year ago. The problem occurs constantly. The problem has been waxing and waning. The symptoms are aggravated by certain foods. Pertinent negatives include no anemia, fatigue, melena, muscle weakness, orthopnea or weight loss. She has tried a PPI for the symptoms. The treatment provided moderate relief.  Diabetes She presents for her follow-up diabetic visit. She has type 2 diabetes mellitus. Her disease course has been fluctuating. Pertinent negatives for hypoglycemia include no dizziness, headaches, nervousness/anxiousness or sweats. There are no diabetic associated symptoms. Pertinent negatives for diabetes include no blurred vision, no chest pain, no fatigue, no foot paresthesias, no foot ulcerations, no polydipsia, no polyphagia, no polyuria, no visual change, no weakness and no weight loss. There are no hypoglycemic complications. There are no diabetic complications. Current diabetic treatment includes oral agent (dual therapy). She is compliant with treatment all of the time. Her weight is increasing steadily. She is following a generally healthy diet. Meal planning includes carbohydrate counting and avoidance of concentrated sweets. She participates in exercise intermittently. Her breakfast blood glucose is taken between 8-9 am. Her breakfast blood glucose range is generally 140-180 mg/dl. An ACE inhibitor/angiotensin II receptor blocker is being taken. She  does not see a podiatrist.Eye exam is not current.  Hyperlipidemia This is a chronic problem. The current episode started more than 1 year ago. The problem is uncontrolled. Recent lipid tests were reviewed and are variable. She has no history of chronic renal disease, diabetes, hypothyroidism, liver disease, obesity or nephrotic syndrome. Factors aggravating her hyperlipidemia include thiazides. Pertinent negatives include no chest pain, focal sensory loss, focal weakness, leg pain, myalgias or shortness of breath. Current antihyperlipidemic treatment includes bile acid squestrants. There are no compliance problems.  Risk factors for coronary artery disease include dyslipidemia and diabetes mellitus.  Urinary Tract Infection  This is a new problem. The current episode started in the past 7 days. The problem occurs intermittently. The problem has been gradually worsening. The quality of the pain is described as burning. The pain is moderate. There has been no fever. Associated symptoms include frequency, hesitancy, nausea, urgency and vomiting. Pertinent negatives include no chills, discharge, flank pain, hematuria or sweats. She has tried increased fluids (cranberry/ water) for the symptoms. The treatment provided no relief.  Abdominal Pain This is a recurrent problem. The current episode started more than 1 month ago (6 month). The onset quality is undetermined. The problem occurs intermittently. The pain is located in the RLQ and right flank. The pain is at a severity of 7/10. The pain is moderate. The quality of the pain is sharp. The abdominal pain radiates to the back. Associated symptoms include frequency, nausea and vomiting. Pertinent negatives include no arthralgias, belching, constipation, diarrhea, dysuria, fever, flatus, headaches, hematochezia, hematuria, melena, myalgias or weight loss. Nothing aggravates the pain. Prior diagnostic workup includes surgery. Her past medical history is significant  for GERD and PUD.    Lab Results  Component Value Date   CREATININE  0.76 04/18/2019   BUN 10 04/18/2019   NA 140 04/18/2019   K 4.7 04/18/2019   CL 101 04/18/2019   CO2 21 04/18/2019   Lab Results  Component Value Date   CHOL 238 (H) 08/23/2018   HDL 76 08/23/2018   LDLCALC 133 (H) 08/23/2018   TRIG 144 08/23/2018   CHOLHDL 3.1 08/23/2018   Lab Results  Component Value Date   TSH 2.970 01/07/2018   Lab Results  Component Value Date   HGBA1C 6.7 (H) 04/18/2019     Review of Systems  Constitutional: Negative.  Negative for chills, fatigue, fever, unexpected weight change and weight loss.  HENT: Negative for congestion, ear discharge, ear pain, hoarse voice, rhinorrhea, sinus pressure, sneezing and sore throat.   Eyes: Negative for blurred vision, photophobia, pain, discharge, redness and itching.  Respiratory: Negative for cough, choking, shortness of breath, wheezing and stridor.   Cardiovascular: Negative for chest pain.  Gastrointestinal: Positive for nausea and vomiting. Negative for abdominal pain, blood in stool, constipation, diarrhea, dysphagia, flatus, heartburn, hematochezia and melena.  Endocrine: Negative for cold intolerance, heat intolerance, polydipsia, polyphagia and polyuria.  Genitourinary: Positive for frequency, hesitancy and urgency. Negative for dysuria, flank pain, hematuria, menstrual problem, pelvic pain, vaginal bleeding and vaginal discharge.  Musculoskeletal: Negative for arthralgias, back pain, myalgias and muscle weakness.  Skin: Negative for rash.  Allergic/Immunologic: Negative for environmental allergies and food allergies.  Neurological: Negative for dizziness, focal weakness, weakness, light-headedness, numbness and headaches.  Hematological: Negative for adenopathy. Does not bruise/bleed easily.  Psychiatric/Behavioral: Negative for dysphoric mood. The patient is not nervous/anxious.     Patient Active Problem List   Diagnosis Date  Noted  . Recurrent major depressive disorder, in partial remission ([place]) 01/06/2018  . Abnormal mammogram 03/02/2017  . 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 Irritable bowel syndrome with diarrhea 03/02/2017  . Type 2 diabetes mellitus without complication, without long-term current use of insulin ([place]) 08/28/2016  . Esophageal reflux 08/28/2016  . Adult hypothyroidism 08/28/2016  . Allergic rhinitis due to pollen 08/28/2016  . Cephalalgia 11/23/2014    Allergies  Allergen Reactions  . Penicillins Other (See Comments)    Does not work for patient  . Azithromycin Rash  . Hydrocodone-Acetaminophen Nausea Only and Rash    Past Surgical History:  Procedure Laterality Date  . BREAST BIOPSY Right 2004   neg  . CHOLECYSTECTOMY    . COLONOSCOPY  2015   repeat 10 yrs- Dr Allen Norris    Social History   Tobacco Use  . Smoking status: Never Smoker  . Smokeless tobacco: Never Used  Substance Use Topics  . Alcohol use: Yes    Alcohol/week: 0.0 standard drinks  . Drug use: No     Medication list has been reviewed and updated.  Current Meds  Medication Sig  . cholestyramine light (PREVALITE) 4 GM/DOSE powder Take 1 packet (4 g total) by mouth daily.  Marland Kitchen glipiZIDE (GLUCOTROL) 5 MG tablet TAKE 1 TABLET BY MOUTH ONCE DAILY BEFORE BREAKFAST  . levothyroxine (SYNTHROID) 25 MCG tablet Take 1 tablet (25 mcg total) by mouth daily.  . metFORMIN (GLUCOPHAGE XR) 500 MG 24 hr tablet Take 1 tablet (500 mg total) by mouth daily with breakfast.  . omeprazole (PRILOSEC) 20 MG capsule Take 1 capsule (20 mg total) by mouth as needed. otc (Patient taking differently: Take 20 mg by mouth daily. )  . triamcinolone (NASACORT) 55 MCG/ACT AERO nasal inhaler Place 2 sprays into the nose daily.  PHQ 2/9 Scores 11/30/2019 08/23/2018 01/30/2017 01/15/2016  PHQ - 2 Score 1 0 0 1  PHQ- 9 Score 6 - - -    BP Readings from Last 3 Encounters:  11/30/19 124/68  04/18/19 130/70  08/23/18 138/76    Physical  Exam Vitals and nursing note reviewed.  Constitutional:      General: She is not in acute distress.    Appearance: She is not diaphoretic.  HENT:     Head: Normocephalic and atraumatic.     Right Ear: Tympanic membrane, ear canal and external ear normal. There is no impacted cerumen.     Left Ear: Tympanic membrane, ear canal and external ear normal. There is no impacted cerumen.     Nose: Nose normal. No congestion or rhinorrhea.  Eyes:     General:        Right eye: No discharge.        Left eye: No discharge.     Conjunctiva/sclera: Conjunctivae normal.     Pupils: Pupils are equal, round, and reactive to light.  Neck:     Thyroid: No thyromegaly.     Vascular: No JVD.  Cardiovascular:     Rate and Rhythm: Normal rate and regular rhythm.     Heart sounds: Normal heart sounds, S1 normal and S2 normal. No murmur. No systolic murmur. No diastolic murmur. No friction rub. No gallop. No S3 or S4 sounds.   Pulmonary:     Effort: Pulmonary effort is normal.     Breath sounds: Normal breath sounds.  Abdominal:     General: Bowel sounds are normal.     Palpations: Abdomen is soft. There is no hepatomegaly or mass.     Tenderness: There is abdominal tenderness in the suprapubic area. There is no right CVA tenderness, left CVA tenderness, guarding or rebound.     Hernia: No hernia is present.  Musculoskeletal:        General: Normal range of motion.     Cervical back: Normal range of motion and neck supple.     Right lower leg: No edema.     Left lower leg: No edema.  Lymphadenopathy:     Cervical: No cervical adenopathy.  Skin:    General: Skin is warm and dry.  Neurological:     Mental Status: She is alert.     Deep Tendon Reflexes: Reflexes are normal and symmetric.     Wt Readings from Last 3 Encounters:  11/30/19 161 lb (73 kg)  04/18/19 150 lb (68 kg)  08/23/18 149 lb (67.6 kg)    BP 124/68   Pulse 88   Ht 5\' 6"  (1.676 m)   Wt 161 lb (73 kg)   BMI 25.99 kg/m    Assessment and Plan:  1. Irritable bowel syndrome with diarrhea Patient with history of irritable bowel disease with diarrhea for which she takes cholestyramine.  Patient will take 1 packet 4 g by mouth daily - cholestyramine light (PREVALITE) 4 GM/DOSE powder; Take 1 packet (4 g total) by mouth daily.  Dispense: 378 g; Refill: 11  2. Type 2 diabetes mellitus without complication, without long-term current use of insulin (HCC) Chronic.  Controlled.  Stable.  Will continue glipizide 5 mg once a day and Metformin 500 XR once a day.  Will check hemoglobin A1c. - HgB A1c - glipiZIDE (GLUCOTROL) 5 MG tablet; TAKE 1 TABLET BY MOUTH ONCE DAILY BEFORE BREAKFAST  Dispense: 90 tablet; Refill: 1 - metFORMIN (GLUCOPHAGE XR) 500  MG 24 hr tablet; Take 1 tablet (500 mg total) by mouth daily with breakfast.  Dispense: 90 tablet; Refill: 1  3. Adult hypothyroidism Chronic.  Controlled.  Stable.  Continue levothyroxine 25 mcg daily.  Will check TSH to see if adequate supplement. - TSH - levothyroxine (SYNTHROID) 25 MCG tablet; Take 1 tablet (25 mcg total) by mouth daily.  Dispense: 90 tablet; Refill: 1  4. Right upper quadrant abdominal pain Patient brings up as a new problem right upper quadrant abdominal pain.  This is been off and on for several months.  We will initiate evaluation with lipase hepatic function and H. pylori.  Patient does have a history of peptic ulcer disease.  We will also check an ultrasound of that specific area. - Lipase - Hepatic function panel - H. pylori antibody, IgG - US Abdomen Limited RUQ; Future - POCT urinalysis dipstick  5. Familial hypercholesterolemia Chronic.  Controlled.  Stable.  Will check lipid panel.  Have discussed need to start statin.  Will initiate atorvastatin 10 mg once a day. - Lipid Panel With LDL/HDL Ratio  6. Gastroesophageal reflux disease without esophagitis Chronic.  Controlled.  Stable.  Will initiate pantoprazole 40 mg once a day.  7.  Cystitis Patient has a urinalysis that has mild leukocytosis.  Will initiate Macrobid twice a day. - nitrofurantoin, macrocrystal-monohydrate, (MACROBID) 100 MG capsule; Take 1 capsule (100 mg total) by mouth 2 (two) times daily.  Dispense: 6 capsule; Refill: 0 - POCT urinalysis dipstick  8. Recurrent major depressive disorder, in partial remission ([place]) Patient has a PHQ score of 6 and we will continue on no medication at present and does not choose to be so.

## 2019-12-02 ENCOUNTER — Other Ambulatory Visit: Payer: Self-pay

## 2019-12-02 ENCOUNTER — Ambulatory Visit
Admission: RE | Admit: 2019-12-02 | Discharge: 2019-12-02 | Disposition: A | Discharge status: Home / Self Care | Payer: Medicare Other | Source: Ambulatory Visit | Attending: Family Medicine | Admitting: Family Medicine

## 2019-12-02 DIAGNOSIS — R1011 Right upper quadrant pain: Secondary | ICD-10-CM | POA: Insufficient documentation

## 2019-12-02 LAB — LIPID PANEL WITH LDL/HDL RATIO
Cholesterol, Total: 225 mg/dL — ABNORMAL HIGH (ref 100–199)
HDL: 56 mg/dL (ref >39)
LDL Chol Calc (NIH): 140 mg/dL — ABNORMAL HIGH (ref 0–99)
LDL/HDL Ratio: 2.5 ratio (ref 0.0–3.2)
Triglycerides: 162 mg/dL — ABNORMAL HIGH (ref 0–149)
VLDL Cholesterol Cal: 29 mg/dL (ref 5–40)

## 2019-12-02 LAB — HEPATIC FUNCTION PANEL
ALT: 27 IU/L (ref 0–32)
AST: 28 IU/L (ref 0–40)
Albumin: 4.9 g/dL — ABNORMAL HIGH (ref 3.7–4.7)
Alkaline Phosphatase: 96 IU/L (ref 39–117)
Bilirubin Total: 0.3 mg/dL (ref 0.0–1.2)
Bilirubin, Direct: 0.13 mg/dL (ref 0.00–0.40)
Total Protein: 7.4 g/dL (ref 6.0–8.5)

## 2019-12-02 LAB — HEMOGLOBIN A1C
Est. average glucose Bld gHb Est-mCnc: 169 mg/dL
Hgb A1c MFr Bld: 7.5 % — ABNORMAL HIGH (ref 4.8–5.6)

## 2019-12-02 LAB — LIPASE: Lipase: 22 U/L (ref 14–85)

## 2019-12-02 LAB — TSH: TSH: 1.94 u[IU]/mL (ref 0.450–4.500)

## 2019-12-02 LAB — H. PYLORI ANTIBODY, IGG: H. pylori, IgG AbS: 0.26 Index Value (ref 0.00–0.79)

## 2020-01-05 ENCOUNTER — Other Ambulatory Visit: Payer: Self-pay

## 2020-01-05 ENCOUNTER — Ambulatory Visit
Admission: RE | Admit: 2020-01-05 | Discharge: 2020-01-05 | Disposition: A | Discharge status: Home / Self Care | Payer: Medicare Other | Source: Ambulatory Visit | Attending: Family Medicine | Admitting: Family Medicine

## 2020-01-05 DIAGNOSIS — Z1231 Encounter for screening mammogram for malignant neoplasm of breast: Secondary | ICD-10-CM | POA: Diagnosis not present

## 2020-03-30 ENCOUNTER — Ambulatory Visit: Payer: Medicare Other | Admitting: Family Medicine

## 2020-04-30 ENCOUNTER — Ambulatory Visit (INDEPENDENT_AMBULATORY_CARE_PROVIDER_SITE_OTHER): Payer: Medicare Other | Admitting: Family Medicine

## 2020-04-30 ENCOUNTER — Other Ambulatory Visit: Payer: Self-pay

## 2020-04-30 ENCOUNTER — Encounter: Payer: Self-pay | Admitting: Family Medicine

## 2020-04-30 VITALS — BP 142/80 | HR 76 | Temp 97.9°F | Ht 66.0 in | Wt 160.0 lb

## 2020-04-30 DIAGNOSIS — M778 Other enthesopathies, not elsewhere classified: Secondary | ICD-10-CM

## 2020-04-30 DIAGNOSIS — E119 Type 2 diabetes mellitus without complications: Secondary | ICD-10-CM

## 2020-04-30 DIAGNOSIS — M722 Plantar fascial fibromatosis: Secondary | ICD-10-CM

## 2020-04-30 DIAGNOSIS — K580 Irritable bowel syndrome with diarrhea: Secondary | ICD-10-CM

## 2020-04-30 MED ORDER — GLIPIZIDE 10 MG PO TABS: 10.0000 mg | ORAL_TABLET | Freq: Every day | ORAL | 1 refills | Status: DC

## 2020-04-30 MED ORDER — METFORMIN HCL ER 750 MG PO TB24: 750.0000 mg | ORAL_TABLET | Freq: Every day | ORAL | 1 refills | Status: DC

## 2020-04-30 MED ORDER — PANTOPRAZOLE SODIUM 40 MG PO TBEC: 40.0000 mg | DELAYED_RELEASE_TABLET | Freq: Every day | ORAL | 0 refills | Status: DC

## 2020-04-30 MED ORDER — MELOXICAM 15 MG PO TABS: 15.0000 mg | ORAL_TABLET | Freq: Every day | ORAL | 0 refills | Status: DC

## 2020-04-30 MED ORDER — CHOLESTYRAMINE LIGHT 4 GM/DOSE PO POWD: 4.0000 g | Freq: Every day | ORAL | 0 refills | Status: DC

## 2020-04-30 NOTE — Progress Notes (Signed)
Date:  04/30/2020   Name:  Rebecca Robbins   DOB:  [birth year]   MRN:  [id number]   Chief Complaint: Diabetes (Foot exam and MICRO needed. ) and Shoulder Pain (Right shoulder pain. Said ever since her last vaccine - after 8 weeks having difficulty lifting arm and sore/ painful. )  Diabetes She presents for her follow-up diabetic visit. She has type 2 diabetes mellitus. Her disease course has been stable. There are no hypoglycemic associated symptoms. Pertinent negatives for hypoglycemia include no confusion, dizziness, headaches, hunger, mood changes, nervousness/anxiousness, pallor, seizures, sleepiness, speech difficulty, sweats or tremors. There are no diabetic associated symptoms. Pertinent negatives for diabetes include no fatigue, no polydipsia, no polyphagia, no polyuria and no weakness. There are no hypoglycemic complications. Symptoms are stable. There are no diabetic complications. Risk factors for coronary artery disease include hypertension. Current diabetic treatment includes oral agent (dual therapy). She is compliant with treatment most of the time. Her weight is stable. She is following a generally healthy diet. Meal planning includes avoidance of concentrated sweets and carbohydrate counting. She participates in exercise intermittently. Her home blood glucose trend is fluctuating minimally. Her breakfast blood glucose is taken between 8-9 am. Her breakfast blood glucose range is generally 130-140 mg/dl. An ACE inhibitor/angiotensin II receptor blocker is not being taken. She does not see a podiatrist.Eye exam is current.  Shoulder Pain  The pain is present in the right shoulder. This is a new problem. The current episode started more than 1 month ago (8 weeks). The problem occurs constantly. The problem has been waxing and waning. The quality of the pain is described as aching. The pain is at a severity of 3/10. The pain is moderate. Pertinent negatives include no fever, inability to  bear weight, joint locking, joint swelling, numbness or stiffness. The symptoms are aggravated by activity. She has tried nothing for the symptoms. The treatment provided moderate relief.  Leg Pain  There was no injury mechanism. The quality of the pain is described as aching. The pain has been intermittent since onset. Pertinent negatives include no inability to bear weight, loss of motion, loss of sensation, muscle weakness or numbness. She has tried nothing for the symptoms. The treatment provided moderate relief.    Lab Results  Component Value Date   CREATININE 0.76 04/18/2019   BUN 10 04/18/2019   NA 140 04/18/2019   K 4.7 04/18/2019   CL 101 04/18/2019   CO2 21 04/18/2019   Lab Results  Component Value Date   CHOL 225 (H) 11/30/2019   HDL 56 11/30/2019   LDLCALC 140 (H) 11/30/2019   TRIG 162 (H) 11/30/2019   CHOLHDL 3.1 08/23/2018   Lab Results  Component Value Date   TSH 1.940 11/30/2019   Lab Results  Component Value Date   HGBA1C 7.5 (H) 11/30/2019   No results found for: WBC, HGB, HCT, MCV, PLT Lab Results  Component Value Date   ALT 27 11/30/2019   AST 28 11/30/2019   ALKPHOS 96 11/30/2019   BILITOT 0.3 11/30/2019     Review of Systems  Constitutional: Negative.  Negative for chills, fatigue, fever and unexpected weight change.  HENT: Negative for congestion, ear discharge, ear pain, rhinorrhea, sinus pressure, sneezing and sore throat.   Eyes: Negative for photophobia, pain, discharge, redness and itching.  Respiratory: Negative for cough, shortness of breath, wheezing and stridor.   Gastrointestinal: Negative for abdominal pain, blood in stool, constipation, diarrhea, nausea and vomiting.  Endocrine:  Negative for cold intolerance, heat intolerance, polydipsia, polyphagia and polyuria.  Genitourinary: Negative for dysuria, flank pain, frequency, hematuria, menstrual problem, pelvic pain, urgency, vaginal bleeding and vaginal discharge.  Musculoskeletal:  Negative for arthralgias, back pain, myalgias and stiffness.  Skin: Negative for pallor and rash.  Allergic/Immunologic: Negative for environmental allergies and food allergies.  Neurological: Negative for dizziness, tremors, seizures, speech difficulty, weakness, light-headedness, numbness and headaches.  Hematological: Negative for adenopathy. Does not bruise/bleed easily.  Psychiatric/Behavioral: Negative for confusion and dysphoric mood. The patient is not nervous/anxious.     Patient Active Problem List   Diagnosis Date Noted  . Recurrent major depressive disorder, in partial remission ([place]) 01/06/2018  . Abnormal mammogram 03/02/2017  . 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 Irritable bowel syndrome with diarrhea 03/02/2017  . Type 2 diabetes mellitus without complication, without long-term current use of insulin ([place]) 08/28/2016  . Esophageal reflux 08/28/2016  . Adult hypothyroidism 08/28/2016  . Allergic rhinitis due to pollen 08/28/2016  . Cephalalgia 11/23/2014    Allergies  Allergen Reactions  . Penicillins Other (See Comments)    Does not work for patient  . Azithromycin Rash  . Hydrocodone-Acetaminophen Nausea Only and Rash    Past Surgical History:  Procedure Laterality Date  . BREAST BIOPSY Right 2004   neg  . CHOLECYSTECTOMY    . COLONOSCOPY  2015   repeat 10 yrs- Dr Allen Norris    Social History   Tobacco Use  . Smoking status: Never Smoker  . Smokeless tobacco: Never Used  Substance Use Topics  . Alcohol use: Yes    Alcohol/week: 0.0 standard drinks  . Drug use: No     Medication list has been reviewed and updated.  Current Meds  Medication Sig  . cholestyramine light (PREVALITE) 4 GM/DOSE powder Take 1 packet (4 g total) by mouth daily.  Marland Kitchen glipiZIDE (GLUCOTROL) 5 MG tablet TAKE 1 TABLET BY MOUTH ONCE DAILY BEFORE BREAKFAST (Patient taking differently: Take 10 mg by mouth daily before breakfast. TAKE 1 TABLET BY MOUTH ONCE DAILY BEFORE BREAKFAST)  .  levothyroxine (SYNTHROID) 25 MCG tablet Take 1 tablet (25 mcg total) by mouth daily.  . metFORMIN (GLUCOPHAGE XR) 500 MG 24 hr tablet Take 1 tablet (500 mg total) by mouth daily with breakfast.  . pantoprazole (PROTONIX) 40 MG tablet Take 1 tablet (40 mg total) by mouth daily.    PHQ 2/9 Scores 04/30/2020 11/30/2019 08/23/2018 01/30/2017  PHQ - 2 Score 0 1 0 0  PHQ- 9 Score 0 6 - -    BP Readings from Last 3 Encounters:  04/30/20 (!) 142/80  11/30/19 124/68  04/18/19 130/70    Physical Exam Vitals and nursing note reviewed.  Constitutional:      General: She is not in acute distress.    Appearance: She is not diaphoretic.  HENT:     Head: Normocephalic and atraumatic.     Right Ear: External ear normal.     Left Ear: External ear normal.     Nose: Nose normal.  Eyes:     General:        Right eye: No discharge.        Left eye: No discharge.     Conjunctiva/sclera: Conjunctivae normal.     Pupils: Pupils are equal, round, and reactive to light.  Neck:     Thyroid: No thyromegaly.     Vascular: No JVD.  Cardiovascular:     Rate and Rhythm: Normal rate and regular rhythm.  Pulses:          Dorsalis pedis pulses are 2+ on the right side and 2+ on the left side.       Posterior tibial pulses are 2+ on the right side and 2+ on the left side.     Heart sounds: Normal heart sounds. No murmur. No friction rub. No gallop.   Pulmonary:     Effort: Pulmonary effort is normal.     Breath sounds: Normal breath sounds. No wheezing or rhonchi.  Abdominal:     General: Bowel sounds are normal.     Palpations: Abdomen is soft. There is no mass.     Tenderness: There is no abdominal tenderness. There is no guarding.  Musculoskeletal:     Right shoulder: Tenderness present. Decreased range of motion.     Cervical back: Normal range of motion and neck supple.     Right foot: Normal range of motion.     Left foot: Normal range of motion. Tenderness present.     Comments: Tender  supraspinatis tendon/tender platartar origin  Feet:     Right foot:     Protective Sensation: 10 sites tested. 10 sites sensed.     Skin integrity: Skin integrity normal.     Toenail Condition: Right toenails are normal.     Left foot:     Protective Sensation: 10 sites tested. 10 sites sensed.     Skin integrity: Skin integrity normal.     Toenail Condition: Left toenails are normal.  Lymphadenopathy:     Cervical: No cervical adenopathy.  Skin:    General: Skin is warm and dry.  Neurological:     Mental Status: She is alert.     Deep Tendon Reflexes: Reflexes are normal and symmetric.     Wt Readings from Last 3 Encounters:  04/30/20 160 lb (72.6 kg)  11/30/19 161 lb (73 kg)  04/18/19 150 lb (68 kg)    BP (!) 142/80   Pulse 76   Temp 97.9 F (36.6 C) (Temporal)   Ht 5\' 6"  (1.676 m)   Wt 160 lb (72.6 kg)   BMI 25.82 kg/m   Assessment and Plan: 1. Type 2 diabetes mellitus without complication, without long-term current use of insulin (HCC) Chronic.  Uncontrolled.  Relatively stable.  Continue glipizide 10 mg 1 a day as well as Glucophage Exar 750 mg once a day.  Will check A1c foot exam was done today with no abnormalities microalbuminuria will also be obtained for evaluation of proteinuria. - glipiZIDE (GLUCOTROL) 10 MG tablet; Take 1 tablet (10 mg total) by mouth daily before breakfast. TAKE 1 TABLET BY MOUTH ONCE DAILY BEFORE BREAKFAST  Dispense: 90 tablet; Refill: 1 - metFORMIN (GLUCOPHAGE-XR) 750 MG 24 hr tablet; Take 1 tablet (750 mg total) by mouth daily with breakfast.  Dispense: 90 tablet; Refill: 1 - HgB A1c - Microalbumin, urine  2. Capsulitis of shoulder, right New onset.  Persistent.  Pain is uncontrolled with limited range of motion.  Patient unable to abduct right arm status post administration of Covid.  This is unlikely given that is far superior than when she would have obtained the injection.  We will refer to orthopedics for evaluation in the meantime  we will initiate meloxicam 15 mg once a day.  Given the persistent nature of this over several weeks will refer to orthopedic evaluation for possible injection. - Ambulatory referral to Orthopedic Surgery - meloxicam (MOBIC) 15 MG tablet; Take 1 tablet (15 mg  total) by mouth daily.  Dispense: 30 tablet; Refill: 0  3. Plantar fasciitis New onset.  Persistent.  Uncontrolled.  Patient was suggested to try some exercises like heel cord lengthening to no avail with this has not improved.  In addition to the meloxicam 15 mg once a day as noted above we will also refer to podiatry for evaluation. - Ambulatory referral to Podiatry - meloxicam (MOBIC) 15 MG tablet; Take 1 tablet (15 mg total) by mouth daily.  Dispense: 30 tablet; Refill: 0

## 2020-05-01 ENCOUNTER — Other Ambulatory Visit: Payer: Self-pay

## 2020-05-01 DIAGNOSIS — E039 Hypothyroidism, unspecified: Secondary | ICD-10-CM

## 2020-05-01 LAB — MICROALBUMIN, URINE: Microalbumin, Urine: 3.5 ug/mL

## 2020-05-01 LAB — HEMOGLOBIN A1C
Est. average glucose Bld gHb Est-mCnc: 146 mg/dL
Hgb A1c MFr Bld: 6.7 % — ABNORMAL HIGH (ref 4.8–5.6)

## 2020-05-01 MED ORDER — LEVOTHYROXINE SODIUM 25 MCG PO TABS: 25.0000 ug | ORAL_TABLET | Freq: Every day | ORAL | 1 refills | Status: DC

## 2020-05-03 ENCOUNTER — Telehealth: Payer: Self-pay | Admitting: Family Medicine

## 2020-05-03 NOTE — Telephone Encounter (Signed)
Copied from [place] [id number]. Topic: General - Other >> May 03, 2020  9:20 AM Celene Kras wrote: Reason for CRM: Pt returning call to Dr. Ronnald Ramp regarding her colonoscopy. Pt states that her last one was 04/25/2014 with Dr. Jenetta Downer. Please advise.

## 2020-05-03 NOTE — Telephone Encounter (Signed)
I put in chart/ abstract

## 2020-05-04 ENCOUNTER — Encounter: Payer: Self-pay | Admitting: Family Medicine

## 2020-05-04 ENCOUNTER — Ambulatory Visit (INDEPENDENT_AMBULATORY_CARE_PROVIDER_SITE_OTHER): Payer: Medicare Other | Admitting: Family Medicine

## 2020-05-04 ENCOUNTER — Telehealth: Payer: Medicare Other | Admitting: Family Medicine

## 2020-05-04 ENCOUNTER — Other Ambulatory Visit: Payer: Self-pay

## 2020-05-04 VITALS — BP 120/80 | HR 80 | Ht 66.0 in | Wt 158.0 lb

## 2020-05-04 DIAGNOSIS — N3090 Cystitis, unspecified without hematuria: Secondary | ICD-10-CM | POA: Diagnosis not present

## 2020-05-04 DIAGNOSIS — E119 Type 2 diabetes mellitus without complications: Secondary | ICD-10-CM | POA: Diagnosis not present

## 2020-05-04 DIAGNOSIS — M7551 Bursitis of right shoulder: Secondary | ICD-10-CM | POA: Diagnosis not present

## 2020-05-04 DIAGNOSIS — M25511 Pain in right shoulder: Secondary | ICD-10-CM | POA: Diagnosis not present

## 2020-05-04 LAB — POCT URINALYSIS DIPSTICK
Bilirubin, UA: NEGATIVE
Glucose, UA: POSITIVE — AB
Ketones, UA: NEGATIVE
Nitrite, UA: NEGATIVE
Protein, UA: NEGATIVE
Spec Grav, UA: <=1.005 — AB (ref 1.010–1.025)
Urobilinogen, UA: 0.2 E.U./dL
pH, UA: 5 (ref 5.0–8.0)

## 2020-05-04 MED ORDER — CEPHALEXIN 500 MG PO CAPS: 500.0000 mg | ORAL_CAPSULE | Freq: Three times a day (TID) | ORAL | 0 refills | Status: DC

## 2020-05-04 NOTE — Progress Notes (Signed)
Date:  05/04/2020   Name:  Rebecca Robbins   DOB:  1946/09/08   MRN:  [id number]   Chief Complaint: Urinary Tract Infection (frequent urination, burning when urinates x 3 days)  Urinary Tract Infection  This is a new problem. The current episode started in the past 7 days (day 3). The problem occurs every urination. The problem has been gradually worsening. The quality of the pain is described as burning. The pain is moderate. There has been no fever. Associated symptoms include frequency and urgency. Pertinent negatives include no chills, discharge, flank pain, hematuria, hesitancy, nausea, sweats or vomiting. She has tried nothing for the symptoms. The treatment provided moderate relief. There is no history of catheterization, kidney stones, recurrent UTIs, a single kidney, urinary stasis or a urological procedure.    Lab Results  Component Value Date   CREATININE 0.76 04/18/2019   BUN 10 04/18/2019   NA 140 04/18/2019   K 4.7 04/18/2019   CL 101 04/18/2019   CO2 21 04/18/2019   Lab Results  Component Value Date   CHOL 225 (H) 11/30/2019   HDL 56 11/30/2019   LDLCALC 140 (H) 11/30/2019   TRIG 162 (H) 11/30/2019   CHOLHDL 3.1 08/23/2018   Lab Results  Component Value Date   TSH 1.940 11/30/2019   Lab Results  Component Value Date   HGBA1C 6.7 (H) 04/30/2020   No results found for: WBC, HGB, HCT, MCV, PLT Lab Results  Component Value Date   ALT 27 11/30/2019   AST 28 11/30/2019   ALKPHOS 96 11/30/2019   BILITOT 0.3 11/30/2019     Review of Systems  Constitutional: Negative.  Negative for chills, fatigue, fever and unexpected weight change.  HENT: Negative for congestion, ear discharge, ear pain, rhinorrhea, sinus pressure, sneezing and sore throat.   Eyes: Negative for photophobia, pain, discharge, redness and itching.  Respiratory: Negative for cough, shortness of breath, wheezing and stridor.   Gastrointestinal: Negative for abdominal pain, blood in stool,  constipation, diarrhea, nausea and vomiting.  Endocrine: Negative for cold intolerance, heat intolerance, polydipsia, polyphagia and polyuria.  Genitourinary: Positive for frequency and urgency. Negative for dysuria, flank pain, hematuria, hesitancy, menstrual problem, pelvic pain, vaginal bleeding and vaginal discharge.  Musculoskeletal: Negative for arthralgias, back pain and myalgias.  Skin: Negative for rash.  Allergic/Immunologic: Negative for environmental allergies and food allergies.  Neurological: Negative for dizziness, weakness, light-headedness, numbness and headaches.  Hematological: Negative for adenopathy. Does not bruise/bleed easily.  Psychiatric/Behavioral: Negative for dysphoric mood. The patient is not nervous/anxious.     Patient Active Problem List   Diagnosis Date Noted  . Recurrent major depressive disorder, in partial remission ([place]) 01/06/2018  . Abnormal mammogram 03/02/2017  . 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 Irritable bowel syndrome with diarrhea 03/02/2017  . Type 2 diabetes mellitus without complication, without long-term current use of insulin ([place]) 08/28/2016  . Esophageal reflux 08/28/2016  . Adult hypothyroidism 08/28/2016  . Allergic rhinitis due to pollen 08/28/2016  . Cephalalgia 11/23/2014    Allergies  Allergen Reactions  . Penicillins Other (See Comments)    Does not work for patient  . Azithromycin Rash  . Hydrocodone-Acetaminophen Nausea Only and Rash    Past Surgical History:  Procedure Laterality Date  . BREAST BIOPSY Right 2004   neg  . CHOLECYSTECTOMY    . COLONOSCOPY  2015   repeat 10 yrs- Dr Allen Norris    Social History   Tobacco Use  . Smoking status:  Never Smoker  . Smokeless tobacco: Never Used  Substance Use Topics  . Alcohol use: Yes    Alcohol/week: 0.0 standard drinks  . Drug use: No     Medication list has been reviewed and updated.  Current Meds  Medication Sig  . cholestyramine light (PREVALITE) 4  GM/DOSE powder Take 1 packet (4 g total) by mouth daily.  Marland Kitchen glipiZIDE (GLUCOTROL) 10 MG tablet Take 1 tablet (10 mg total) by mouth daily before breakfast. TAKE 1 TABLET BY MOUTH ONCE DAILY BEFORE BREAKFAST  . levothyroxine (SYNTHROID) 25 MCG tablet Take 1 tablet (25 mcg total) by mouth daily.  . meloxicam (MOBIC) 15 MG tablet Take 1 tablet (15 mg total) by mouth daily.  . metFORMIN (GLUCOPHAGE-XR) 750 MG 24 hr tablet Take 1 tablet (750 mg total) by mouth daily with breakfast.  . pantoprazole (PROTONIX) 40 MG tablet Take 1 tablet (40 mg total) by mouth daily.  Marland Kitchen triamcinolone (NASACORT) 55 MCG/ACT AERO nasal inhaler Place 2 sprays into the nose daily.    PHQ 2/9 Scores 05/04/2020 04/30/2020 11/30/2019 08/23/2018  PHQ - 2 Score 0 0 1 0  PHQ- 9 Score 0 0 6 -    BP Readings from Last 3 Encounters:  05/04/20 120/80  04/30/20 (!) 142/80  11/30/19 124/68    Physical Exam Vitals and nursing note reviewed.  Constitutional:      General: She is not in acute distress.    Appearance: She is not diaphoretic.  HENT:     Head: Normocephalic and atraumatic.     Right Ear: Tympanic membrane, ear canal and external ear normal.     Left Ear: Tympanic membrane, ear canal and external ear normal.     Nose: Nose normal.  Eyes:     General:        Right eye: No discharge.        Left eye: No discharge.     Conjunctiva/sclera: Conjunctivae normal.     Pupils: Pupils are equal, round, and reactive to light.  Neck:     Thyroid: No thyromegaly.     Vascular: No JVD.  Cardiovascular:     Rate and Rhythm: Normal rate and regular rhythm.     Heart sounds: Normal heart sounds. No murmur. No friction rub. No gallop.   Pulmonary:     Effort: Pulmonary effort is normal.     Breath sounds: Normal breath sounds. No wheezing, rhonchi or rales.  Chest:     Chest wall: No tenderness.  Abdominal:     General: Bowel sounds are normal.     Palpations: Abdomen is soft. There is no mass.     Tenderness: There is  abdominal tenderness in the suprapubic area. There is no guarding.  Musculoskeletal:        General: Normal range of motion.     Cervical back: Normal range of motion and neck supple.  Lymphadenopathy:     Cervical: No cervical adenopathy.  Skin:    General: Skin is warm and dry.  Neurological:     Mental Status: She is alert.     Deep Tendon Reflexes: Reflexes are normal and symmetric.     Wt Readings from Last 3 Encounters:  05/04/20 158 lb (71.7 kg)  04/30/20 160 lb (72.6 kg)  11/30/19 161 lb (73 kg)    BP 120/80   Pulse 80   Ht 5\' 6"  (1.676 m)   Wt 158 lb (71.7 kg)   BMI 25.50 kg/m   Assessment  and Plan: 1. Cystitis New onset.  Persistent.  Patient's history and physical exam is consistent with urinary tract infection with tenderness over the suprapubic area and no CVA tenderness.  Urinalysis is negative for leukocytes and some blood.  We will initiate cephalexin 500 mg 3 times a day for 5 days. - POCT urinalysis dipstick

## 2020-05-07 ENCOUNTER — Ambulatory Visit: Payer: Medicare Other | Admitting: Family Medicine

## 2020-05-07 DIAGNOSIS — M79672 Pain in left foot: Secondary | ICD-10-CM | POA: Diagnosis not present

## 2020-05-07 DIAGNOSIS — M722 Plantar fascial fibromatosis: Secondary | ICD-10-CM | POA: Diagnosis not present

## 2020-05-17 ENCOUNTER — Other Ambulatory Visit: Payer: Self-pay | Admitting: Family Medicine

## 2020-05-17 ENCOUNTER — Other Ambulatory Visit: Payer: Self-pay

## 2020-05-17 DIAGNOSIS — B379 Candidiasis, unspecified: Secondary | ICD-10-CM

## 2020-05-17 DIAGNOSIS — N3090 Cystitis, unspecified without hematuria: Secondary | ICD-10-CM

## 2020-05-17 MED ORDER — FLUCONAZOLE 150 MG PO TABS: 150.0000 mg | ORAL_TABLET | Freq: Once | ORAL | 0 refills | Status: AC

## 2020-05-17 MED ORDER — NITROFURANTOIN MONOHYD MACRO 100 MG PO CAPS: 100.0000 mg | ORAL_CAPSULE | Freq: Two times a day (BID) | ORAL | 0 refills | Status: DC

## 2020-05-17 NOTE — Progress Notes (Signed)
Sent in [place] and diflucan to [facility] [place]. Will see pt on Monday if not better

## 2020-05-17 NOTE — Telephone Encounter (Signed)
Copied from [place] [id number]. Topic: General - Other >> May 17, 2020  8:57 AM Celene Kras wrote: Reason for CRM: Pt caleld and is requesting to speak with Baxter Flattery. Pt states that she finished her antibiotic for her uti last week, but she started to feel pressure again this morning. Pt is requesting to have Keflex refilled. Please advise.   [id number] [place], [place] [address] Phone: [phone] Fax: [phone] Not a 24 hour pharmacy; exact hours not known.

## 2020-05-21 ENCOUNTER — Ambulatory Visit: Payer: Self-pay | Admitting: Family Medicine

## 2020-05-27 ENCOUNTER — Telehealth: Payer: Self-pay | Admitting: Family Medicine

## 2020-05-27 DIAGNOSIS — E119 Type 2 diabetes mellitus without complications: Secondary | ICD-10-CM

## 2020-05-29 NOTE — Telephone Encounter (Signed)
Patient called in inquiring about medication as pharmacy notified her it had been denied. Patient informed of previous refill in May that contained refill for medication. Pharmacy informed patient they have no refills for medication. Please advise.

## 2020-05-29 NOTE — Telephone Encounter (Signed)
Spoke to pt- sent in glipizide on 5/17 to [facility]

## 2020-08-16 DIAGNOSIS — H6123 Impacted cerumen, bilateral: Secondary | ICD-10-CM | POA: Diagnosis not present

## 2020-08-16 DIAGNOSIS — M26609 Unspecified temporomandibular joint disorder, unspecified side: Secondary | ICD-10-CM | POA: Diagnosis not present

## 2020-08-26 ENCOUNTER — Other Ambulatory Visit: Payer: Self-pay | Admitting: Family Medicine

## 2020-08-26 NOTE — Telephone Encounter (Signed)
Requested Prescriptions  Pending Prescriptions Disp Refills   pantoprazole (PROTONIX) 40 MG tablet [Pharmacy Med Name: PANTOPRAZOLE SOD DR 40 MG TAB] 90 tablet 3    Sig: TAKE 1 TABLET BY MOUTH EVERY DAY     Gastroenterology: Proton Pump Inhibitors Passed - 08/26/2020  9:12 AM      Passed - Valid encounter within last 12 months    Recent Outpatient Visits          3 months ago [facility] Juline Patch, MD   3 months ago Type 2 diabetes mellitus without complication, without long-term current use of insulin ([place])   [facility] Juline Patch, MD   9 months ago Type 2 diabetes mellitus without complication, without long-term current use of insulin ([place])   [facility] Juline Patch, MD   1 year ago Type 2 diabetes mellitus without complication, without long-term current use of insulin ([place])   [facility] Juline Patch, MD   2 years ago Type 2 diabetes mellitus without complication, without long-term current use of insulin ([place])   [facility] Juline Patch, MD      Future Appointments            In 1 week Juline Patch, MD [facility], [facility]

## 2020-08-30 ENCOUNTER — Other Ambulatory Visit: Payer: Self-pay | Admitting: Orthopedic Surgery

## 2020-08-30 ENCOUNTER — Other Ambulatory Visit (HOSPITAL_COMMUNITY): Payer: Self-pay | Admitting: Orthopedic Surgery

## 2020-08-30 DIAGNOSIS — M25519 Pain in unspecified shoulder: Secondary | ICD-10-CM

## 2020-08-30 DIAGNOSIS — E119 Type 2 diabetes mellitus without complications: Secondary | ICD-10-CM | POA: Diagnosis not present

## 2020-08-30 DIAGNOSIS — M7551 Bursitis of right shoulder: Secondary | ICD-10-CM | POA: Diagnosis not present

## 2020-08-30 DIAGNOSIS — M25311 Other instability, right shoulder: Secondary | ICD-10-CM | POA: Diagnosis not present

## 2020-09-03 ENCOUNTER — Ambulatory Visit: Payer: Medicare Other | Admitting: Family Medicine

## 2020-09-18 ENCOUNTER — Ambulatory Visit
Admission: RE | Admit: 2020-09-18 | Discharge: 2020-09-18 | Disposition: A | Discharge status: Home / Self Care | Payer: Medicare Other | Source: Ambulatory Visit | Attending: Orthopedic Surgery | Admitting: Orthopedic Surgery

## 2020-09-18 ENCOUNTER — Other Ambulatory Visit: Payer: Self-pay

## 2020-09-18 DIAGNOSIS — M25519 Pain in unspecified shoulder: Secondary | ICD-10-CM | POA: Diagnosis not present

## 2020-09-18 DIAGNOSIS — M7551 Bursitis of right shoulder: Secondary | ICD-10-CM | POA: Insufficient documentation

## 2020-09-18 DIAGNOSIS — M25311 Other instability, right shoulder: Secondary | ICD-10-CM | POA: Diagnosis not present

## 2020-09-18 DIAGNOSIS — M25511 Pain in right shoulder: Secondary | ICD-10-CM | POA: Diagnosis not present

## 2020-09-26 ENCOUNTER — Telehealth: Payer: Self-pay | Admitting: Family Medicine

## 2020-09-26 NOTE — Telephone Encounter (Signed)
Left message for patient to call back and schedule Medicare Annual Wellness Visit (AWV) either virtually/audio only or in office. Whichever the patients preference is.  Last AWV 10/2012, please schedule at anytime with [facility] Advisor.  This should be a 40 minute visit.  AWV-S AS OF 10/15/2013 PER [place]

## 2020-10-09 DIAGNOSIS — M25311 Other instability, right shoulder: Secondary | ICD-10-CM | POA: Diagnosis not present

## 2020-10-10 ENCOUNTER — Other Ambulatory Visit: Payer: Self-pay | Admitting: Orthopedic Surgery

## 2020-10-15 DIAGNOSIS — Z23 Encounter for immunization: Secondary | ICD-10-CM | POA: Diagnosis not present

## 2020-10-18 ENCOUNTER — Other Ambulatory Visit: Payer: Self-pay

## 2020-10-18 ENCOUNTER — Encounter: Payer: Self-pay | Admitting: Orthopedic Surgery

## 2020-10-24 ENCOUNTER — Other Ambulatory Visit
Admission: RE | Admit: 2020-10-24 | Discharge: 2020-10-24 | Disposition: A | Discharge status: Home / Self Care | Payer: Medicare Other | Source: Ambulatory Visit | Attending: Orthopedic Surgery | Admitting: Orthopedic Surgery

## 2020-10-24 DIAGNOSIS — Z01812 Encounter for preprocedural laboratory examination: Secondary | ICD-10-CM | POA: Diagnosis not present

## 2020-10-24 DIAGNOSIS — Z20822 Contact with and (suspected) exposure to covid-19: Secondary | ICD-10-CM | POA: Diagnosis not present

## 2020-10-24 LAB — SARS CORONAVIRUS 2 (TAT 6-24 HRS): SARS Coronavirus 2: NEGATIVE

## 2020-10-26 ENCOUNTER — Ambulatory Visit
Admission: RE | Admit: 2020-10-26 | Discharge: 2020-10-26 | Disposition: A | Discharge status: Home / Self Care | Payer: Medicare Other | Attending: Orthopedic Surgery | Admitting: Orthopedic Surgery

## 2020-10-26 ENCOUNTER — Other Ambulatory Visit: Payer: Self-pay

## 2020-10-26 ENCOUNTER — Encounter: Payer: Self-pay | Admitting: Orthopedic Surgery

## 2020-10-26 ENCOUNTER — Ambulatory Visit: Payer: Medicare Other | Admitting: Anesthesiology

## 2020-10-26 ENCOUNTER — Encounter
Admission: RE | Disposition: A | Discharge status: Home / Self Care | Payer: Self-pay | Source: Home / Self Care | Attending: Orthopedic Surgery

## 2020-10-26 DIAGNOSIS — Z885 Allergy status to narcotic agent status: Secondary | ICD-10-CM | POA: Insufficient documentation

## 2020-10-26 DIAGNOSIS — M7521 Bicipital tendinitis, right shoulder: Secondary | ICD-10-CM | POA: Diagnosis not present

## 2020-10-26 DIAGNOSIS — Z8371 Family history of colonic polyps: Secondary | ICD-10-CM | POA: Insufficient documentation

## 2020-10-26 DIAGNOSIS — K219 Gastro-esophageal reflux disease without esophagitis: Secondary | ICD-10-CM | POA: Diagnosis not present

## 2020-10-26 DIAGNOSIS — M7541 Impingement syndrome of right shoulder: Secondary | ICD-10-CM | POA: Insufficient documentation

## 2020-10-26 DIAGNOSIS — Z880 Allergy status to penicillin: Secondary | ICD-10-CM | POA: Diagnosis not present

## 2020-10-26 DIAGNOSIS — Z820 Family history of epilepsy and other diseases of the nervous system: Secondary | ICD-10-CM | POA: Insufficient documentation

## 2020-10-26 DIAGNOSIS — G8918 Other acute postprocedural pain: Secondary | ICD-10-CM | POA: Diagnosis not present

## 2020-10-26 DIAGNOSIS — M25511 Pain in right shoulder: Secondary | ICD-10-CM | POA: Diagnosis not present

## 2020-10-26 DIAGNOSIS — Z79899 Other long term (current) drug therapy: Secondary | ICD-10-CM | POA: Diagnosis not present

## 2020-10-26 DIAGNOSIS — Z881 Allergy status to other antibiotic agents status: Secondary | ICD-10-CM | POA: Diagnosis not present

## 2020-10-26 DIAGNOSIS — M7551 Bursitis of right shoulder: Secondary | ICD-10-CM | POA: Diagnosis not present

## 2020-10-26 DIAGNOSIS — E119 Type 2 diabetes mellitus without complications: Secondary | ICD-10-CM | POA: Insufficient documentation

## 2020-10-26 DIAGNOSIS — M25111 Fistula, right shoulder: Secondary | ICD-10-CM | POA: Diagnosis not present

## 2020-10-26 DIAGNOSIS — M67813 Other specified disorders of tendon, right shoulder: Secondary | ICD-10-CM | POA: Diagnosis not present

## 2020-10-26 DIAGNOSIS — Z7984 Long term (current) use of oral hypoglycemic drugs: Secondary | ICD-10-CM | POA: Diagnosis not present

## 2020-10-26 DIAGNOSIS — M7581 Other shoulder lesions, right shoulder: Secondary | ICD-10-CM | POA: Diagnosis not present

## 2020-10-26 DIAGNOSIS — M75101 Unspecified rotator cuff tear or rupture of right shoulder, not specified as traumatic: Secondary | ICD-10-CM | POA: Diagnosis not present

## 2020-10-26 DIAGNOSIS — M75121 Complete rotator cuff tear or rupture of right shoulder, not specified as traumatic: Secondary | ICD-10-CM | POA: Diagnosis not present

## 2020-10-26 HISTORY — DX: Hypothyroidism, unspecified: E03.9

## 2020-10-26 HISTORY — DX: Other specified postprocedural states: Z98.890

## 2020-10-26 HISTORY — DX: Nausea with vomiting, unspecified: R11.2

## 2020-10-26 LAB — GLUCOSE, CAPILLARY
Glucose-Capillary: 154 mg/dL — ABNORMAL HIGH (ref 70–99)
Glucose-Capillary: 204 mg/dL — ABNORMAL HIGH (ref 70–99)

## 2020-10-26 SURGERY — SHOULDER ARTHROSCOPY WITH SUBACROMIAL DECOMPRESSION AND DISTAL CLAVICLE EXCISION
Anesthesia: Regional | Laterality: Right

## 2020-10-26 MED ORDER — BUPIVACAINE LIPOSOME 1.3 % IJ SUSP
INTRAMUSCULAR | Status: DC | PRN
  Administered 2020-10-26: 20 mL

## 2020-10-26 MED ORDER — SCOPOLAMINE 1 MG/3DAYS TD PT72
1.0000 | MEDICATED_PATCH | TRANSDERMAL | Status: DC
  Administered 2020-10-26: 1.5 mg via TRANSDERMAL

## 2020-10-26 MED ORDER — OXYCODONE HCL 5 MG PO TABS
5.0000 mg | ORAL_TABLET | ORAL | 0 refills | Status: DC | PRN
Start: 2020-10-26 — End: 2020-12-18

## 2020-10-26 MED ORDER — ACETAMINOPHEN 160 MG/5ML PO SOLN: 960.0000 mg | Freq: Once | ORAL | Status: DC

## 2020-10-26 MED ORDER — ASPIRIN EC 325 MG PO TBEC: 325.0000 mg | DELAYED_RELEASE_TABLET | Freq: Every day | ORAL | 0 refills | Status: AC

## 2020-10-26 MED ORDER — BUPIVACAINE HCL (PF) 0.5 % IJ SOLN
INTRAMUSCULAR | Status: DC | PRN
  Administered 2020-10-26: 20 mL

## 2020-10-26 MED ORDER — LIDOCAINE HCL (CARDIAC) PF 100 MG/5ML IV SOSY
PREFILLED_SYRINGE | INTRAVENOUS | Status: DC | PRN
  Administered 2020-10-26: 100 mg via INTRATRACHEAL

## 2020-10-26 MED ORDER — ACETAMINOPHEN 500 MG PO TABS: 1000.0000 mg | ORAL_TABLET | Freq: Once | ORAL | Status: DC

## 2020-10-26 MED ORDER — DEXAMETHASONE SODIUM PHOSPHATE 4 MG/ML IJ SOLN
INTRAMUSCULAR | Status: DC | PRN
  Administered 2020-10-26: 6 mg via INTRAVENOUS

## 2020-10-26 MED ORDER — FENTANYL CITRATE (PF) 100 MCG/2ML IJ SOLN
INTRAMUSCULAR | Status: DC | PRN
  Administered 2020-10-26: 100 ug via INTRAVENOUS
  Administered 2020-10-26: 50 ug via INTRAVENOUS

## 2020-10-26 MED ORDER — MIDAZOLAM HCL 5 MG/5ML IJ SOLN
INTRAMUSCULAR | Status: DC | PRN
  Administered 2020-10-26: 2 mg via INTRAVENOUS

## 2020-10-26 MED ORDER — CLINDAMYCIN PHOSPHATE 900 MG/50ML IV SOLN
900.0000 mg | INTRAVENOUS | Status: AC
  Administered 2020-10-26: 900 mg via INTRAVENOUS

## 2020-10-26 MED ORDER — ONDANSETRON 4 MG PO TBDP: 4.0000 mg | ORAL_TABLET | Freq: Three times a day (TID) | ORAL | 0 refills | Status: DC | PRN

## 2020-10-26 MED ORDER — PROPOFOL 10 MG/ML IV BOLUS
INTRAVENOUS | Status: DC | PRN
  Administered 2020-10-26: 150 mg via INTRAVENOUS

## 2020-10-26 MED ORDER — ONDANSETRON HCL 4 MG/2ML IJ SOLN
INTRAMUSCULAR | Status: DC | PRN
  Administered 2020-10-26: 4 mg via INTRAVENOUS

## 2020-10-26 MED ORDER — PROPOFOL 500 MG/50ML IV EMUL
INTRAVENOUS | Status: DC | PRN
  Administered 2020-10-26: 120 ug/kg/min via INTRAVENOUS

## 2020-10-26 MED ORDER — ACETAMINOPHEN 500 MG PO TABS: 1000.0000 mg | ORAL_TABLET | Freq: Three times a day (TID) | ORAL | 2 refills | Status: AC

## 2020-10-26 MED ORDER — LACTATED RINGERS IV SOLN: INTRAVENOUS | Status: DC

## 2020-10-26 MED ORDER — LACTATED RINGERS IV SOLN
INTRAVENOUS | Status: DC | PRN
  Administered 2020-10-26: 16000 mL

## 2020-10-26 MED ORDER — HYDROMORPHONE HCL 1 MG/ML IJ SOLN: 0.2500 mg | INTRAMUSCULAR | Status: DC | PRN

## 2020-10-26 SURGICAL SUPPLY — 66 items
ADAPTER IRRIG TUBE 2 SPIKE SOL (ADAPTER) ×6 IMPLANT
ADH SKN CLS APL DERMABOND .7 (GAUZE/BANDAGES/DRESSINGS) ×1
ADPR TBG 2 SPK PMP STRL ASCP (ADAPTER) ×2
ANCH SUT SWLK 19.1X4.75 (Anchor) ×1 IMPLANT
ANCH SUT SWLK 19.1X5.5 CLS (Anchor) ×1 IMPLANT
ANCHOR ICONIX SPEED 2.3 (Anchor) ×1 IMPLANT
ANCHOR ICONIX SPEED 2.3MM (Anchor) ×1 IMPLANT
ANCHOR SUT BIO SW 4.75X19.1 (Anchor) ×2 IMPLANT
ANCHOR SWIVELOCK BIO COMP (Anchor) ×2 IMPLANT
APL PRP STRL LF DISP 70% ISPRP (MISCELLANEOUS) ×1
BUR BR 5.5 12 FLUTE (BURR) ×3 IMPLANT
BUR RADIUS 4.0X18.5 (BURR) ×3 IMPLANT
CANNULA 5.75X7CM (CANNULA) ×1
CANNULA PART THRD DISP 5.75X7 (CANNULA) ×2 IMPLANT
CANNULA PARTIAL THREAD 2X7 (CANNULA) ×2 IMPLANT
CHLORAPREP W/TINT 26 (MISCELLANEOUS) ×3 IMPLANT
COOLER POLAR GLACIER W/PUMP (MISCELLANEOUS) ×3 IMPLANT
COVER LIGHT HANDLE UNIVERSAL (MISCELLANEOUS) ×6 IMPLANT
DERMABOND ADVANCED (GAUZE/BANDAGES/DRESSINGS) ×2
DERMABOND ADVANCED .7 DNX12 (GAUZE/BANDAGES/DRESSINGS) IMPLANT
DRAPE IMP U-DRAPE 54X76 (DRAPES) ×6 IMPLANT
DRAPE INCISE IOBAN 66X45 STRL (DRAPES) ×3 IMPLANT
DRAPE U-SHAPE 48X52 POLY STRL (PACKS) ×3 IMPLANT
DRSG TEGADERM 4X4.75 (GAUZE/BANDAGES/DRESSINGS) ×12 IMPLANT
ELECT REM PT RETURN 9FT ADLT (ELECTROSURGICAL) ×3
ELECTRODE REM PT RTRN 9FT ADLT (ELECTROSURGICAL) IMPLANT
GAUZE XEROFORM 1X8 LF (GAUZE/BANDAGES/DRESSINGS) ×3 IMPLANT
GLOVE BIO SURGEON STRL SZ7.5 (GLOVE) ×6 IMPLANT
GLOVE BIOGEL PI IND STRL 8 (GLOVE) ×1 IMPLANT
GLOVE BIOGEL PI INDICATOR 8 (GLOVE) ×2
GOWN STRL REIN 2XL XLG LVL4 (GOWN DISPOSABLE) ×3 IMPLANT
GOWN STRL REUS W/ TWL LRG LVL3 (GOWN DISPOSABLE) ×1 IMPLANT
GOWN STRL REUS W/TWL LRG LVL3 (GOWN DISPOSABLE) ×3
IV LACTATED RINGER IRRG 3000ML (IV SOLUTION) ×48
IV LR IRRIG 3000ML ARTHROMATIC (IV SOLUTION) ×8 IMPLANT
KIT STABILIZATION SHOULDER (MISCELLANEOUS) ×3 IMPLANT
KIT TURNOVER KIT A (KITS) ×3 IMPLANT
MANIFOLD NEPTUNE II (INSTRUMENTS) ×3 IMPLANT
MASK FACE SPIDER DISP (MASK) ×3 IMPLANT
MAT GRAY ABSORB FLUID 28X50 (MISCELLANEOUS) ×6 IMPLANT
NDL SAFETY ECLIPSE 18X1.5 (NEEDLE) ×1 IMPLANT
NDL SCORPION MULTI FIRE (NEEDLE) IMPLANT
NEEDLE HYPO 18GX1.5 SHARP (NEEDLE) ×3
NEEDLE SCORPION MULTI FIRE (NEEDLE) ×3 IMPLANT
PACK ARTHROSCOPY SHOULDER (MISCELLANEOUS) ×3 IMPLANT
PAD WRAPON POLAR SHDR UNIV (MISCELLANEOUS) ×1 IMPLANT
PENCIL SMOKE EVACUATOR (MISCELLANEOUS) ×2 IMPLANT
SET TUBE SUCT SHAVER OUTFL 24K (TUBING) ×3 IMPLANT
SET TUBE TIP INTRA-ARTICULAR (MISCELLANEOUS) ×3 IMPLANT
SPONGE GAUZE 2X2 8PLY STER LF (GAUZE/BANDAGES/DRESSINGS) ×4
SPONGE GAUZE 2X2 8PLY STRL LF (GAUZE/BANDAGES/DRESSINGS) ×8 IMPLANT
SUT ETHILON 3-0 FS-10 30 BLK (SUTURE) ×3
SUT MNCRL 4-0 (SUTURE) ×3
SUT MNCRL 4-0 27XMFL (SUTURE) ×1
SUT VIC AB 0 CT1 36 (SUTURE) ×2 IMPLANT
SUT VIC AB 2-0 CT2 27 (SUTURE) ×2 IMPLANT
SUTURE EHLN 3-0 FS-10 30 BLK (SUTURE) IMPLANT
SUTURE MNCRL 4-0 27XMF (SUTURE) IMPLANT
SUTURE TAPE 1.3 40 TPR END (SUTURE) IMPLANT
SUTURETAPE 1.3 40 TPR END (SUTURE) ×6
SYR 10ML LL (SYRINGE) ×3 IMPLANT
SYSTEM FBRTK BICEPS 1.9 DRILL (Anchor) ×2 IMPLANT
TAPE MICROFOAM 4IN (TAPE) ×3 IMPLANT
TUBING ARTHRO INFLOW-ONLY STRL (TUBING) ×3 IMPLANT
WAND WEREWOLF FLOW 90D (MISCELLANEOUS) ×3 IMPLANT
WRAPON POLAR PAD SHDR UNIV (MISCELLANEOUS) ×3

## 2020-10-26 NOTE — Anesthesia Procedure Notes (Signed)
Anesthesia Regional Block: Interscalene brachial plexus block   Pre-Anesthetic Checklist: ,, timeout performed, Correct Patient, Correct Site, Correct Laterality, Correct Procedure, Correct Position, site marked, Risks and benefits discussed,  Surgical consent,  Pre-op evaluation,  At surgeon's request and post-op pain management  Laterality: Right  Prep: chloraprep       Needles:   Needle Type: Stimiplex     Needle Length: 9cm  Needle Gauge: 22     Additional Needles:   Procedures:,,,, ultrasound used (permanent image in chart),,,,  Narrative:  Start time: 10/26/2020 7:08 AM End time: 10/26/2020 7:12 AM Injection made incrementally with aspirations every 5 mL.  Performed by: Personally  Anesthesiologist: Carlos American, MD

## 2020-10-26 NOTE — Transfer of Care (Signed)
Immediate Anesthesia Transfer of Care Note  Patient: Rebecca Robbins  Procedure(s) Performed: Right shoulder arthroscopic subscapularis repair; mini-open rotator cuff repair, subacromial decompression, and biceps tenodesis (Right )  Patient Location: PACU  Anesthesia Type: General, Regional  Level of Consciousness: awake, alert  and patient cooperative  Airway and Oxygen Therapy: Patient Spontanous Breathing and Patient connected to supplemental oxygen  Post-op Assessment: Post-op Vital signs reviewed, Patient's Cardiovascular Status Stable, Respiratory Function Stable, Patent Airway and No signs of Nausea or vomiting  Post-op Vital Signs: Reviewed and stable  Complications: No complications documented.

## 2020-10-26 NOTE — Anesthesia Procedure Notes (Signed)
Procedure Name: LMA Insertion Date/Time: 10/26/2020 7:50 AM Performed by: Dionne Bucy, CRNA Pre-anesthesia Checklist: Patient identified, Patient being monitored, Timeout performed, Emergency Drugs available and Suction available Patient Re-evaluated:Patient Re-evaluated prior to induction Oxygen Delivery Method: Circle system utilized Preoxygenation: Pre-oxygenation with 100% oxygen Induction Type: IV induction Ventilation: Mask ventilation without difficulty LMA: LMA inserted LMA Size: 4.0 Tube type: Oral Number of attempts: 1 Placement Confirmation: positive ETCO2 and breath sounds checked- equal and bilateral Tube secured with: Tape Dental Injury: Teeth and Oropharynx as per pre-operative assessment

## 2020-10-26 NOTE — Progress Notes (Signed)
Assisted Ben Redmon ANMD with right, ultrasound guided, interscalene  block. Side rails up, monitors on throughout procedure. See vital signs in flow sheet. Tolerated Procedure well.

## 2020-10-26 NOTE — Anesthesia Preprocedure Evaluation (Signed)
Anesthesia Evaluation  Patient identified by MRN, date of birth, ID band  History of Anesthesia Complications (+) PONV and history of anesthetic complications  Airway Mallampati: II  TM Distance: >3 FB Neck ROM: Full    Dental   Pulmonary    Pulmonary exam normal        Cardiovascular negative cardio ROS Normal cardiovascular exam     Neuro/Psych  Headaches, PSYCHIATRIC DISORDERS Depression    GI/Hepatic GERD  ,  Endo/Other  diabetesHypothyroidism   Renal/GU      Musculoskeletal   Abdominal   Peds  Hematology   Anesthesia Other Findings   Reproductive/Obstetrics                             Anesthesia Physical Anesthesia Plan  ASA: II  Anesthesia Plan: General and Regional   Post-op Pain Management:    Induction:   PONV Risk Score and Plan: 2 and Ondansetron and Scopolamine patch - Pre-op  Airway Management Planned: LMA  Additional Equipment:   Intra-op Plan:   Post-operative Plan:   Informed Consent: I have reviewed the patients History and Physical, chart, labs and discussed the procedure including the risks, benefits and alternatives for the proposed anesthesia with the patient or authorized representative who has indicated his/her understanding and acceptance.     Dental advisory given  Plan Discussed with: CRNA and Anesthesiologist  Anesthesia Plan Comments:         Anesthesia Quick Evaluation

## 2020-10-26 NOTE — Discharge Instructions (Addendum)
Post-Op Instructions - Rotator Cuff Repair  1. Bracing: You will wear a shoulder immobilizer or sling for 6 weeks.   2. Driving: No driving for 3 weeks post-op. When driving, do not wear the immobilizer. Ideally, we recommend no driving for 6 weeks while sling is in place as one arm will be immobilized.   3. Activity: No active lifting for 2 months. Wrist, hand, and elbow motion only. Avoid lifting the upper arm away from the body except for hygiene. You are permitted to bend and straighten the elbow passively only (no active elbow motion). You may use your hand and wrist for typing, writing, and managing utensils (cutting food). Do not lift more than a coffee cup for 8 weeks.  When sleeping or resting, inclined positions (recliner chair or wedge pillow) and a pillow under the forearm for support may provide better comfort for up to 4 weeks.  Avoid long distance travel for 4 weeks.  Return to normal activities after rotator cuff repair repair normally takes 6 months on average. If rehab goes very well, may be able to do most activities at 4 months, except overhead or contact sports.  4. Physical Therapy: Begins 3-4 days after surgery, and proceed 1 time per week for the first 6 weeks, then 1-2 times per week from weeks 6-20 post-op.  5. Medications:  - You will be provided a prescription for narcotic pain medicine. After surgery, take 1-2 narcotic tablets every 4 hours if needed for severe pain.  - A prescription for anti-nausea medication will be provided in case the narcotic medicine causes nausea - take 1 tablet every 6 hours only if nauseated.   - Take tylenol 1000 mg (2 Extra Strength tablets or 3 regular strength) every 8 hours for pain.  May decrease or stop tylenol 5 days after surgery if you are having minimal pain. - Take ASA 325mg/day x 2 weeks to help prevent DVTs/PEs (blood clots).  - DO NOT take ANY nonsteroidal anti-inflammatory pain medications (Advil, Motrin, Ibuprofen, Aleve,  Naproxen, or Naprosyn). These medicines can inhibit healing of your shoulder repair.    If you are taking prescription medication for anxiety, depression, insomnia, muscle spasm, chronic pain, or for attention deficit disorder, you are advised that you are at a higher risk of adverse effects with use of narcotics post-op, including narcotic addiction/dependence, depressed breathing, death. If you use non-prescribed substances: alcohol, marijuana, cocaine, heroin, methamphetamines, etc., you are at a higher risk of adverse effects with use of narcotics post-op, including narcotic addiction/dependence, depressed breathing, death. You are advised that taking > 50 morphine milligram equivalents (MME) of narcotic pain medication per day results in twice the risk of overdose or death. For your prescription provided: oxycodone 5 mg - taking more than 6 tablets per day would result in > 50 morphine milligram equivalents (MME) of narcotic pain medication. Be advised that we will prescribe narcotics short-term, for acute post-operative pain only - 3 weeks for major operations such as shoulder repair/reconstruction surgeries.     6. Post-Op Appointment:  Your first post-op appointment will be 10-14 days post-op.  7. Work or School: For most, but not all procedures, we advise staying out of work or school for at least 1 to 2 weeks in order to recover from the stress of surgery and to allow time for healing.   If you need a work or school note this can be provided.   8. Smoking: If you are a smoker, you need to refrain from   smoking in the postoperative period. The nicotine in cigarettes will inhibit healing of your shoulder repair and decrease the chance of successful repair. Similarly, nicotine containing products (gum, patches) should be avoided.   Post-operative Brace: Apply and remove the brace you received as you were instructed to at the time of fitting and as described in detail as the brace's  instructions for use indicate.  Wear the brace for the period of time prescribed by your physician.  The brace can be cleaned with soap and water and allowed to air dry only.  Should the brace result in increased pain, decreased feeling (numbness/tingling), increased swelling or an overall worsening of your medical condition, please contact your doctor immediately.  If an emergency situation occurs as a result of wearing the brace after normal business hours, please dial 911 and seek immediate medical attention.  Let your doctor know if you have any further questions about the brace issued to you. Refer to the shoulder sling instructions for use if you have any questions regarding the correct fit of your shoulder sling.  [place] [place] [facility]: [phone]  Video that illustrates how to properly use a shoulder sling: "Instructions for Proper Use of an Orthopaedic Sling" [email or web address]         Scopolamine skin patches REMOVE PATCH IN 72 HOURS AND [place] HANDS IMMEDIATELY  What is this medicine? SCOPOLAMINE (skoe POL a meen) is used to prevent nausea and vomiting caused by motion sickness, anesthesia and surgery. This medicine may be used for other purposes; ask your health care provider or pharmacist if you have questions. COMMON BRAND NAME(S): Transderm Scop What should I tell my health care provider before I take this medicine? They need to know if you have any of these conditions:  are scheduled to have a gastric secretion test  glaucoma  heart disease  kidney disease  liver disease  lung or breathing disease, like asthma  mental illness  prostate disease  seizures  stomach or intestine problems  trouble passing urine  an unusual or allergic reaction to scopolamine, atropine, other medicines, foods, dyes, or preservatives  pregnant or trying to get pregnant  breast-feeding How should I use this medicine? This medicine  is for external use only. Follow the directions on the prescription label. Wear only 1 patch at a time. Choose an area behind the ear, that is clean, dry, hairless and free from any cuts or irritation. Wipe the area with a clean dry tissue. Peel off the plastic backing of the skin patch, trying not to touch the adhesive side with your hands. Do not cut the patches. Firmly apply to the area you have chosen, with the metallic side of the patch to the skin and the tan-colored side showing. Once firmly in place, wash your hands well with soap and water. Do not get this medicine into your eyes. After removing the patch, wash your hands and the area behind your ear thoroughly with soap and water. The patch will still contain some medicine after use. To avoid accidental contact or ingestion by children or pets, fold the used patch in half with the sticky side together and throw away in the trash out of the reach of children and pets. If you need to use a second patch after you remove the first, place it behind the other ear. A special MedGuide will be given to you by the pharmacist with each prescription and refill. Be sure to read this information carefully each time. Talk to  your pediatrician regarding the use of this medicine in children. Special care may be needed. Overdosage: If you think you have taken too much of this medicine contact a poison control center or emergency room at once. NOTE: This medicine is only for you. Do not share this medicine with others. What if I miss a dose? This does not apply. This medicine is not for regular use. What may interact with this medicine?  alcohol  antihistamines for allergy cough and cold  atropine  certain medicines for anxiety or sleep  certain medicines for bladder problems like oxybutynin, tolterodine  certain medicines for depression like amitriptyline, fluoxetine, sertraline  certain medicines for stomach problems like dicyclomine,  hyoscyamine  certain medicines for Parkinson's disease like benztropine, trihexyphenidyl  certain medicines for seizures like phenobarbital, primidone  general anesthetics like halothane, isoflurane, methoxyflurane, propofol  ipratropium  local anesthetics like lidocaine, pramoxine, tetracaine  medicines that relax muscles for surgery  phenothiazines like chlorpromazine, mesoridazine, prochlorperazine, thioridazine  narcotic medicines for pain  other belladonna alkaloids This list may not describe all possible interactions. Give your health care provider a list of all the medicines, herbs, non-prescription drugs, or dietary supplements you use. Also tell them if you smoke, drink alcohol, or use illegal drugs. Some items may interact with your medicine. What should I watch for while using this medicine? Limit contact with water while swimming and bathing because the patch may fall off. If the patch falls off, throw it away and put a new one behind the other ear. You may get drowsy or dizzy. Do not drive, use machinery, or do anything that needs mental alertness until you know how this medicine affects you. Do not stand or sit up quickly, especially if you are an older patient. This reduces the risk of dizzy or fainting spells. Alcohol may interfere with the effect of this medicine. Avoid alcoholic drinks. Your mouth may get dry. Chewing sugarless gum or sucking hard candy, and drinking plenty of water may help. Contact your healthcare professional if the problem does not go away or is severe. This medicine may cause dry eyes and blurred vision. If you wear contact lenses, you may feel some discomfort. Lubricating drops may help. See your healthcare professional if the problem does not go away or is severe. If you are going to need surgery, an MRI, CT scan, or other procedure, tell your healthcare professional that you are using this medicine. You may need to remove the patch before the  procedure. What side effects may I notice from receiving this medicine? Side effects that you should report to your doctor or health care professional as soon as possible:  allergic reactions like skin rash, itching or hives; swelling of the face, lips, or tongue  blurred vision  changes in vision  confusion  dizziness  eye pain  fast, irregular heartbeat  hallucinations, loss of contact with reality  nausea, vomiting  pain or trouble passing urine  restlessness  seizures  skin irritation  stomach pain Side effects that usually do not require medical attention (report to your doctor or health care professional if they continue or are bothersome):  drowsiness  dry mouth  headache  sore throat This list may not describe all possible side effects. Call your doctor for medical advice about side effects. You may report side effects to FDA at 1-800-FDA-1088. Where should I keep my medicine? Keep out of the reach of children. Store at room temperature between 20 and 25 degrees C (68  and 77 degrees F). Keep this medicine in the foil package until ready to use. Throw away any unused medicine after the expiration date. NOTE: This sheet is a summary. It may not cover all possible information. If you have questions about this medicine, talk to your doctor, pharmacist, or health care provider.  2020 Elsevier/Gold Standard (2018-02-19 16:14:46)   General Anesthesia, Adult, Care After This sheet gives you information about how to care for yourself after your procedure. Your health care provider may also give you more specific instructions. If you have problems or questions, contact your health care provider. What can I expect after the procedure? After the procedure, the following side effects are common:  Pain or discomfort at the IV site.  Nausea.  Vomiting.  Sore throat.  Trouble concentrating.  Feeling cold or chills.  Weak or tired.  Sleepiness and  fatigue.  Soreness and body aches. These side effects can affect parts of the body that were not involved in surgery. Follow these instructions at home:  For at least 24 hours after the procedure:  Have a responsible adult stay with you. It is important to have someone help care for you until you are awake and alert.  Rest as needed.  Do not: ? Participate in activities in which you could fall or become injured. ? Drive. ? Use heavy machinery. ? Drink alcohol. ? Take sleeping pills or medicines that cause drowsiness. ? Make important decisions or sign legal documents. ? Take care of children on your own. Eating and drinking  Follow any instructions from your health care provider about eating or drinking restrictions.  When you feel hungry, start by eating small amounts of foods that are soft and easy to digest (bland), such as toast. Gradually return to your regular diet.  Drink enough fluid to keep your urine pale yellow.  If you vomit, rehydrate by drinking water, juice, or clear broth. General instructions  If you have sleep apnea, surgery and certain medicines can increase your risk for breathing problems. Follow instructions from your health care provider about wearing your sleep device: ? Anytime you are sleeping, including during daytime naps. ? While taking prescription pain medicines, sleeping medicines, or medicines that make you drowsy.  Return to your normal activities as told by your health care provider. Ask your health care provider what activities are safe for you.  Take over-the-counter and prescription medicines only as told by your health care provider.  If you smoke, do not smoke without supervision.  Keep all follow-up visits as told by your health care provider. This is important. Contact a health care provider if:  You have nausea or vomiting that does not get better with medicine.  You cannot eat or drink without vomiting.  You have pain that  does not get better with medicine.  You are unable to pass urine.  You develop a skin rash.  You have a fever.  You have redness around your IV site that gets worse. Get help right away if:  You have difficulty breathing.  You have chest pain.  You have blood in your urine or stool, or you vomit blood. Summary  After the procedure, it is common to have a sore throat or nausea. It is also common to feel tired.  Have a responsible adult stay with you for the first 24 hours after general anesthesia. It is important to have someone help care for you until you are awake and alert.  When you feel hungry,  start by eating small amounts of foods that are soft and easy to digest (bland), such as toast. Gradually return to your regular diet.  Drink enough fluid to keep your urine pale yellow.  Return to your normal activities as told by your health care provider. Ask your health care provider what activities are safe for you. This information is not intended to replace advice given to you by your health care provider. Make sure you discuss any questions you have with your health care provider. Document Revised: 12/04/2017 Document Reviewed: 07/17/2017 Elsevier Patient Education  Welcome.

## 2020-10-26 NOTE — H&P (Signed)
Paper H&P to be scanned into permanent record. H&P reviewed. No significant changes noted.  

## 2020-10-26 NOTE — Anesthesia Postprocedure Evaluation (Signed)
Anesthesia Post Note  Patient: [place] E Zylstra  Procedure(s) Performed: Right shoulder arthroscopic subscapularis repair; mini-open rotator cuff repair, subacromial decompression, and biceps tenodesis (Right )     Patient location during evaluation: PACU Anesthesia Type: Regional Level of consciousness: awake and alert Pain management: pain level controlled Vital Signs Assessment: post-procedure vital signs reviewed and stable Respiratory status: spontaneous breathing, nonlabored ventilation, respiratory function stable and patient connected to nasal cannula oxygen Cardiovascular status: stable and blood pressure returned to baseline Postop Assessment: no apparent nausea or vomiting Anesthetic complications: no   No complications documented.  Wanda Plump Gildo Crisco

## 2020-10-26 NOTE — Op Note (Signed)
SURGERY DATE: 10/26/2020  PRE-OP DIAGNOSIS:  1. Right rotator cuff tear (subscapularis, supraspinatus) 2. Right subacromial impingement 3. Right biceps tendinopathy  POST-OP DIAGNOSIS: 1. Right rotator cuff tear (subscapularis, supraspinatus) 2. Right subacromial impingement 3. Right biceps tendinopathy  PROCEDURES:  1. Right arthroscopic rotator cuff repair (subscapularis) 2. Right mini-open rotator cuff repair (supraspinatus) 3. Right open biceps tenodesis 4. Right arthroscopic extensive debridement of shoulder (glenohumeral and subacromial spaces) 5. Right arthroscopic subacromial decompression  SURGEON: Cato Mulligan, MD  ASSISTANT: Anitra Lauth, PA  ANESTHESIA: Gen with Exparil interscalene block  ESTIMATED BLOOD LOSS: 25cc  DRAINS:  none  TOTAL IV FLUIDS: per anesthesia   SPECIMENS: none  IMPLANTS:  - Arthrex 4.35mm SwiveLock x 2 - Arthrex FiberTak Suture Anchor - Double Loaded x1 - Iconix SPEED double loaded with 1.2 and 2.82mm tape x 1   OPERATIVE FINDINGS:  Examination under anesthesia: A careful examination under anesthesia was performed.  Passive range of motion was: FF: 160; ER at side: 80; ER in abduction: 120; IR in abduction: 45.  Anterior load shift: NT.  Posterior load shift: NT.  Sulcus in neutral: NT.  Sulcus in ER: NT.    Intra-operative findings: A thorough arthroscopic examination of the shoulder was performed.  The findings are: 1. Biceps tendon: Tendinopathy at the biceps anchor 2. Superior labrum: Type II SLAP tear 3. Posterior labrum and capsule: normal 4. Inferior capsule and inferior recess: normal 5. Glenoid cartilage surface: Mild grade 1 changes  6. Supraspinatus attachment: full-thickness tear anteriorly 7. Posterior rotator cuff attachment: Normal 8. Humeral head articular cartilage: normal 9. Rotator interval: significant synovitis 10: Subscapularis tendon: full-thickness tear 11. Anterior labrum: Mildly degenerative 12. IGHL:  significant synovitis around IGHL  OPERATIVE REPORT:   Indications for procedure: Rebecca Robbins is a 74 y.o. female with ~8 months of shoulder pain that began after she received her Covid vaccination. Since that time, she has failed non-operative management including activity modification, corticosteroid injection, and medical management without adequate relief of symptoms. Clinical exam and MRI were suggestive of rotator cuff tear including subscapularis and supraspinatus tears; subacromial impingement; and biceps tendinopathy. Given the persistent symptoms and failure of nonoperative management, we decided to proceed with surgical management.  After discussion of risks, benefits, and alternatives to surgery, the patient elected to proceed.   Procedure in detail:  I identified Rebecca Robbins in the pre-operative holding area.  I marked the operative shoulder with my initials. I reviewed the risks and benefits of the proposed surgical intervention, and the patient (and/or patient's guardian) wished to proceed.  Anesthesia was then performed with an Exparil interscalene block.  The patient was transferred to the operative suite and placed in the beach chair position.    SCDs were placed on the lower extremities. Appropriate IV antibiotics were administered prior to incision. The operative upper extremity was then prepped and draped in standard fashion. A time out was performed confirming the correct extremity, correct patient, and correct procedure.   I then created a standard posterior portal with an 11 blade. The glenohumeral joint was easily entered with a blunt trochar and the arthroscope introduced. The findings of diagnostic arthroscopy are described above.  A standard anterior portal was made.  I debrided degenerative tissue including the synovitic tissue about the rotator interval and IGHL as well as the anterior and superior labrum. I then coagulated the inflamed synovium to obtain  hemostasis and reduce the risk of post-operative swelling using an Arthrocare radiofrequency device.  The biceps tendon was cut at its insertion on the superior labrum with arthroscopic scissors.  The subscapularis tear was identified.  A superior anterolateral portal was made under needle localization.  A 7 mm cannula was placed.  Subscapularis tendon itself was not significantly retracted.  The tip of the coracoid as well as the conjoined tendon and coracoacromial ligaments were visualized after debriding rotator interval tissue.  Tissue about the subscapularis was released anteriorly, superiorly, and posteriorly to allow for improved mobilization and visualization.  The lesser tuberosity footprint was prepared with a combination of electrocautery and an arthroscopic curette.  A Scorpion suture passing device was used to pass 2 SutureTapes in a mattress fashion through the subscapularis tendon.  These were passed from the anterolateral portal and after passage the sutures were retrieved from the anterior portal.  These were passed through a 5.5 mm SwiveLock and placed into the lesser tuberosity at the footprint of the subscapularis tendon with the arm in a neutral position.  This appropriately reduced the subscapularis tear.  The arm was then internally and externally rotated and the subscapularis was noted to move appropriately with rotation.  Next, the arthroscope was then introduced into the subacromial space. A direct lateral portal was created with an 11-blade after spinal needle localization. An extensive subacromial bursectomy was performed using a combination of the shaver and Arthrocare wand. The entire acromial undersurface was exposed and the CA ligament was subperiosteally elevated to expose the anterior acromial hook. A 5.106mm barrel burr was used to create a flat anterior and lateral aspect of the acromion, converting it from a Type 2 to a Type 1 acromion. Care was made to keep the deltoid fascia  intact.  The supraspinatus tear was visualized from the bursal surface.  It was confirmed to be a full-thickness tear.  This concluded the arthroscopic portion of the procedure.  Next, A longitudinal incision from the anterolateral acromion ~6cm in length was made overlying the raphe between the anterior and middle heads of the deltoid.  This incision also incorporated the anterolateral portal.  The raphe was identified and it was incised. The subacromial space was identified. Any remaining bursa was excised. The rotator cuff tear involving the supraspinatus and part of the infraspinatus was identified. It was a U-shaped tear involving the anterior supraspinatus.  We then turned our attention to the biceps tenodesis. The arm was externally rotated.  The bicipital groove was identified.  A 15 blade was used to make a cut overlying the biceps tendon, and the tendon was removed using a right angle clamp.  The base of the bicipital groove was identified and cleared of soft tissue.  A FiberTak anchor was placed in the bicipital groove.  The biceps tendon was held at the appropriate amount of tension.  One set of sutures was passed through the biceps anchor with one limb passed in a simple fashion and the second limb passed in a simple plus locking stitch pattern.  This was repeated for the other set of sutures.  This construct allowed for shuttling the biceps tendon down to the bone.  The sutures were tied and cut.  The diseased portion of the proximal biceps was then excised.  The arm was then internally rotated.  The rotator cuff footprint was cleared of soft tissue and the footprint was smoothed and bleeding bone over the footprint was created with the burr.  One Iconix SPEED anchor was placed just lateral to the articular margin.  The rotator cuff was  held in a reduced position and all limbs of suture were passed appropriately with a scorpion suture passer. One SwiveLock anchor was placed for lateral row  fixation after passage of all four strands of suture through the anchor. This allowed for excellent reapproximation and compression of the rotator cuff over its footprint. The construct was stable with external and internal rotation.  The wound was thoroughly irrigated.  The deltoid split was closed with 0 Vicryl.  The subdermal layer was closed with 2-0 Vicryl.  The skin was closed with 4-0 Monocryl and Dermabond. The portals were closed with 3-0 Nylon. Xeroform was applied to the incisions. A sterile dressing was applied, followed by a Polar Care sleeve and a SlingShot shoulder immobilizer/sling. The patient was awakened from anesthesia without difficulty and was transferred to the PACU in stable condition.   Of note, assistance from a PA was essential to performing the surgery.  PA was present for the entire surgery.  PA assisted with patient positioning, retraction, instrumentation, and wound closure. The surgery would have been more difficult and had longer operative time without PA assistance.     COMPLICATIONS: none  DISPOSITION: plan for discharge home after recovery in PACU   POSTOPERATIVE PLAN: Remain in sling (except hygiene and elbow/wrist/hand RoM exercises as instructed by PT) x 6 weeks and NWB for this time. PT to begin 3-4 days after surgery.  Use rotator cuff repair rehab protocol with subscapularis repair.  ASA 325mg  daily x 2 weeks for DVT ppx.

## 2020-10-29 DIAGNOSIS — M25511 Pain in right shoulder: Secondary | ICD-10-CM | POA: Diagnosis not present

## 2020-10-29 DIAGNOSIS — M25611 Stiffness of right shoulder, not elsewhere classified: Secondary | ICD-10-CM | POA: Diagnosis not present

## 2020-10-29 DIAGNOSIS — Z9889 Other specified postprocedural states: Secondary | ICD-10-CM | POA: Diagnosis not present

## 2020-10-29 DIAGNOSIS — M6281 Muscle weakness (generalized): Secondary | ICD-10-CM | POA: Diagnosis not present

## 2020-10-30 ENCOUNTER — Other Ambulatory Visit: Payer: Self-pay | Admitting: Family Medicine

## 2020-10-30 DIAGNOSIS — E039 Hypothyroidism, unspecified: Secondary | ICD-10-CM

## 2020-10-30 DIAGNOSIS — E119 Type 2 diabetes mellitus without complications: Secondary | ICD-10-CM

## 2020-11-05 DIAGNOSIS — Z9889 Other specified postprocedural states: Secondary | ICD-10-CM | POA: Diagnosis not present

## 2020-11-05 DIAGNOSIS — M25611 Stiffness of right shoulder, not elsewhere classified: Secondary | ICD-10-CM | POA: Diagnosis not present

## 2020-11-05 DIAGNOSIS — M6281 Muscle weakness (generalized): Secondary | ICD-10-CM | POA: Diagnosis not present

## 2020-11-05 DIAGNOSIS — M25511 Pain in right shoulder: Secondary | ICD-10-CM | POA: Diagnosis not present

## 2020-11-23 DIAGNOSIS — M25511 Pain in right shoulder: Secondary | ICD-10-CM | POA: Diagnosis not present

## 2020-11-29 DIAGNOSIS — M6281 Muscle weakness (generalized): Secondary | ICD-10-CM | POA: Diagnosis not present

## 2020-11-29 DIAGNOSIS — Z9889 Other specified postprocedural states: Secondary | ICD-10-CM | POA: Diagnosis not present

## 2020-11-29 DIAGNOSIS — M25611 Stiffness of right shoulder, not elsewhere classified: Secondary | ICD-10-CM | POA: Diagnosis not present

## 2020-11-29 DIAGNOSIS — M25511 Pain in right shoulder: Secondary | ICD-10-CM | POA: Diagnosis not present

## 2020-12-03 DIAGNOSIS — Z23 Encounter for immunization: Secondary | ICD-10-CM | POA: Diagnosis not present

## 2020-12-05 DIAGNOSIS — M6281 Muscle weakness (generalized): Secondary | ICD-10-CM | POA: Diagnosis not present

## 2020-12-05 DIAGNOSIS — Z9889 Other specified postprocedural states: Secondary | ICD-10-CM | POA: Diagnosis not present

## 2020-12-05 DIAGNOSIS — M25611 Stiffness of right shoulder, not elsewhere classified: Secondary | ICD-10-CM | POA: Diagnosis not present

## 2020-12-05 DIAGNOSIS — M25511 Pain in right shoulder: Secondary | ICD-10-CM | POA: Diagnosis not present

## 2020-12-12 ENCOUNTER — Other Ambulatory Visit: Payer: Self-pay | Admitting: Family Medicine

## 2020-12-12 DIAGNOSIS — E119 Type 2 diabetes mellitus without complications: Secondary | ICD-10-CM

## 2020-12-12 DIAGNOSIS — M25511 Pain in right shoulder: Secondary | ICD-10-CM | POA: Diagnosis not present

## 2020-12-12 DIAGNOSIS — Z9889 Other specified postprocedural states: Secondary | ICD-10-CM | POA: Diagnosis not present

## 2020-12-12 DIAGNOSIS — M6281 Muscle weakness (generalized): Secondary | ICD-10-CM | POA: Diagnosis not present

## 2020-12-12 DIAGNOSIS — M25611 Stiffness of right shoulder, not elsewhere classified: Secondary | ICD-10-CM | POA: Diagnosis not present

## 2020-12-18 ENCOUNTER — Ambulatory Visit (INDEPENDENT_AMBULATORY_CARE_PROVIDER_SITE_OTHER): Payer: Medicare Other | Admitting: Family Medicine

## 2020-12-18 ENCOUNTER — Other Ambulatory Visit: Payer: Self-pay

## 2020-12-18 ENCOUNTER — Encounter: Payer: Self-pay | Admitting: Family Medicine

## 2020-12-18 VITALS — BP 120/80 | HR 68 | Ht 66.0 in | Wt 151.0 lb

## 2020-12-18 DIAGNOSIS — R5383 Other fatigue: Secondary | ICD-10-CM

## 2020-12-18 DIAGNOSIS — E039 Hypothyroidism, unspecified: Secondary | ICD-10-CM

## 2020-12-18 DIAGNOSIS — E119 Type 2 diabetes mellitus without complications: Secondary | ICD-10-CM | POA: Diagnosis not present

## 2020-12-18 DIAGNOSIS — K219 Gastro-esophageal reflux disease without esophagitis: Secondary | ICD-10-CM

## 2020-12-18 DIAGNOSIS — E7801 Familial hypercholesterolemia: Secondary | ICD-10-CM

## 2020-12-18 DIAGNOSIS — K580 Irritable bowel syndrome with diarrhea: Secondary | ICD-10-CM

## 2020-12-18 MED ORDER — METFORMIN HCL ER 750 MG PO TB24: ORAL_TABLET | ORAL | 1 refills | Status: DC

## 2020-12-18 MED ORDER — GLIPIZIDE 10 MG PO TABS: ORAL_TABLET | ORAL | 1 refills | Status: DC

## 2020-12-18 MED ORDER — PANTOPRAZOLE SODIUM 40 MG PO TBEC: 40.0000 mg | DELAYED_RELEASE_TABLET | Freq: Every day | ORAL | 1 refills | Status: DC

## 2020-12-18 MED ORDER — LEVOTHYROXINE SODIUM 25 MCG PO TABS: 25.0000 ug | ORAL_TABLET | Freq: Every day | ORAL | 1 refills | Status: DC

## 2020-12-18 MED ORDER — CHOLESTYRAMINE LIGHT 4 GM/DOSE PO POWD: 4.0000 g | Freq: Every day | ORAL | 0 refills | Status: DC

## 2020-12-18 NOTE — Progress Notes (Signed)
Date:  12/18/2020   Name:  Rebecca Robbins   DOB:  Jan 07, 1946   MRN:  [id number]   Chief Complaint: Hypothyroidism, Hyperlipidemia, and Diabetes (140s on the BS at home)  Hyperlipidemia This is a chronic problem. The current episode started more than 1 year ago. The problem is controlled. Recent lipid tests were reviewed and are normal. She has no history of diabetes, hypothyroidism, liver disease, obesity or nephrotic syndrome. There are no known factors aggravating her hyperlipidemia. Pertinent negatives include no focal sensory loss, focal weakness, leg pain, myalgias or shortness of breath. Current antihyperlipidemic treatment includes bile acid sequestrants. The current treatment provides moderate improvement of lipids. There are no compliance problems.  Risk factors for coronary artery disease include diabetes mellitus, dyslipidemia and hypertension.  Diabetes She presents for her follow-up diabetic visit. She has type 2 diabetes mellitus. Her disease course has been stable. There are no hypoglycemic associated symptoms. Pertinent negatives for hypoglycemia include no dizziness, headaches, nervousness/anxiousness or tremors. Associated symptoms include weight loss. Pertinent negatives for diabetes include no fatigue, no polydipsia, no polyphagia, no polyuria, no visual change and no weakness. There are no hypoglycemic complications. Symptoms are stable. There are no diabetic complications. Risk factors for coronary artery disease include dyslipidemia, diabetes mellitus and hypertension. Current diabetic treatment includes oral agent (dual therapy) (glipizide/metformen). She is compliant with treatment all of the time. She is following a generally healthy diet. Meal planning includes avoidance of concentrated sweets and carbohydrate counting. An ACE inhibitor/angiotensin II receptor blocker is being taken.  Thyroid Problem Presents for follow-up visit. Symptoms include weight loss. Patient  reports no anxiety, cold intolerance, constipation, depressed mood, diaphoresis, diarrhea, dry skin, fatigue, hair loss, heat intolerance, hoarse voice, leg swelling, menstrual problem, nail problem, tremors, visual change or weight gain. The symptoms have been stable. Her past medical history is significant for hyperlipidemia. There is no history of diabetes.    Lab Results  Component Value Date   CREATININE 0.76 04/18/2019   BUN 10 04/18/2019   NA 140 04/18/2019   K 4.7 04/18/2019   CL 101 04/18/2019   CO2 21 04/18/2019   Lab Results  Component Value Date   CHOL 225 (H) 11/30/2019   HDL 56 11/30/2019   LDLCALC 140 (H) 11/30/2019   TRIG 162 (H) 11/30/2019   CHOLHDL 3.1 08/23/2018   Lab Results  Component Value Date   TSH 1.940 11/30/2019   Lab Results  Component Value Date   HGBA1C 6.7 (H) 04/30/2020   No results found for: WBC, HGB, HCT, MCV, PLT Lab Results  Component Value Date   ALT 27 11/30/2019   AST 28 11/30/2019   ALKPHOS 96 11/30/2019   BILITOT 0.3 11/30/2019     Review of Systems  Constitutional: Positive for weight loss. Negative for chills, diaphoresis, fatigue, fever, unexpected weight change and weight gain.  HENT: Negative for congestion, ear discharge, ear pain, hoarse voice, rhinorrhea, sinus pressure, sneezing and sore throat.   Eyes: Negative for double vision, photophobia, pain, discharge, redness and itching.  Respiratory: Negative for cough, shortness of breath, wheezing and stridor.   Gastrointestinal: Negative for abdominal pain, blood in stool, constipation, diarrhea, nausea and vomiting.  Endocrine: Negative for cold intolerance, heat intolerance, polydipsia, polyphagia and polyuria.  Genitourinary: Negative for dysuria, flank pain, frequency, hematuria, menstrual problem, pelvic pain, urgency, vaginal bleeding and vaginal discharge.  Musculoskeletal: Negative for arthralgias, back pain and myalgias.  Skin: Negative for rash.   Allergic/Immunologic: Negative for environmental  allergies and food allergies.  Neurological: Negative for dizziness, tremors, focal weakness, weakness, light-headedness, numbness and headaches.  Hematological: Negative for adenopathy. Does not bruise/bleed easily.  Psychiatric/Behavioral: Negative for dysphoric mood. The patient is not nervous/anxious.     Patient Active Problem List   Diagnosis Date Noted  . Recurrent major depressive disorder, in partial remission ([place]) 01/06/2018  . Abnormal mammogram 03/02/2017  . 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 Irritable bowel syndrome with diarrhea 03/02/2017  . Type 2 diabetes mellitus without complication, without long-term current use of insulin ([place]) 08/28/2016  . Esophageal reflux 08/28/2016  . Adult hypothyroidism 08/28/2016  . Allergic rhinitis due to pollen 08/28/2016  . Cephalalgia 11/23/2014    Allergies  Allergen Reactions  . Penicillins Other (See Comments)    Does not work for patient  . Azithromycin Rash  . Hydrocodone-Acetaminophen Nausea Only and Rash    Past Surgical History:  Procedure Laterality Date  . BREAST BIOPSY Right 2004   neg  . CHOLECYSTECTOMY    . COLONOSCOPY  2015   repeat 10 yrs- Dr Allen Norris  . MENISCUS REPAIR Bilateral    Right x2, Left x1    Social History   Tobacco Use  . Smoking status: Never Smoker  . Smokeless tobacco: Never Used  Vaping Use  . Vaping Use: Never used  Substance Use Topics  . Alcohol use: Yes    Alcohol/week: 3.0 standard drinks    Types: 3 Glasses of wine per week  . Drug use: No     Medication list has been reviewed and updated.  Current Meds  Medication Sig  . acetaminophen (TYLENOL) 500 MG tablet Take 2 tablets (1,000 mg total) by mouth every 8 (eight) hours.  . cholestyramine light (PREVALITE) 4 GM/DOSE powder Take 1 packet (4 g total) by mouth daily.  Marland Kitchen glipiZIDE (GLUCOTROL) 10 MG tablet TAKE 1 TABLET BY MOUTH ONCE DAILY BEFORE BREAKFAST  . levothyroxine  (SYNTHROID) 25 MCG tablet TAKE 1 TABLET BY MOUTH EVERY DAY  . metFORMIN (GLUCOPHAGE-XR) 750 MG 24 hr tablet TAKE 1 TABLET BY MOUTH EVERY DAY WITH BREAKFAST  . pantoprazole (PROTONIX) 40 MG tablet TAKE 1 TABLET BY MOUTH EVERY DAY  . triamcinolone (NASACORT) 55 MCG/ACT AERO nasal inhaler Place 2 sprays into the nose daily.  . [DISCONTINUED] ondansetron (ZOFRAN ODT) 4 MG disintegrating tablet Take 1 tablet (4 mg total) by mouth every 8 (eight) hours as needed for nausea or vomiting.    PHQ 2/9 Scores 12/18/2020 05/04/2020 04/30/2020 11/30/2019  PHQ - 2 Score 0 0 0 1  PHQ- 9 Score 0 0 0 6    GAD 7 : Generalized Anxiety Score 05/04/2020 04/30/2020 11/30/2019  Nervous, Anxious, on Edge 0 0 0  Control/stop worrying 0 0 0  Worry too much - different things 0 0 0  Trouble relaxing 0 0 0  Restless 0 0 0  Easily annoyed or irritable 0 0 0  Afraid - awful might happen 0 0 0  Total GAD 7 Score 0 0 0  Anxiety Difficulty - Not difficult at all -    BP Readings from Last 3 Encounters:  12/18/20 120/80  10/26/20 (!) 142/79  05/04/20 120/80    Physical Exam Vitals and nursing note reviewed.  Constitutional:      Appearance: She is well-developed and well-nourished.  HENT:     Head: Normocephalic.     Right Ear: Tympanic membrane, ear canal and external ear normal.     Left Ear: Tympanic membrane, ear canal  and external ear normal.     Nose: Nose normal.     Mouth/Throat:     Mouth: Oropharynx is clear and moist. Mucous membranes are moist.  Eyes:     General: Lids are everted, no foreign bodies appreciated. No scleral icterus.       Left eye: No foreign body or hordeolum.     Extraocular Movements: EOM normal.     Conjunctiva/sclera: Conjunctivae normal.     Right eye: Right conjunctiva is not injected.     Left eye: Left conjunctiva is not injected.     Pupils: Pupils are equal, round, and reactive to light.  Neck:     Thyroid: No thyromegaly.     Vascular: No JVD.     Trachea: No  tracheal deviation.  Cardiovascular:     Rate and Rhythm: Normal rate and regular rhythm.     Pulses: Intact distal pulses.     Heart sounds: Normal heart sounds. No murmur heard. No friction rub. No gallop.   Pulmonary:     Effort: Pulmonary effort is normal. No respiratory distress.     Breath sounds: Normal breath sounds. No wheezing, rhonchi or rales.  Abdominal:     General: Bowel sounds are normal.     Palpations: Abdomen is soft. There is no hepatosplenomegaly or mass.     Tenderness: There is no abdominal tenderness. There is no guarding or rebound.  Musculoskeletal:        General: No tenderness or edema. Normal range of motion.     Cervical back: Normal range of motion and neck supple.  Lymphadenopathy:     Cervical: No cervical adenopathy.  Skin:    General: Skin is warm.     Capillary Refill: Capillary refill takes less than 2 seconds.     Findings: No rash.  Neurological:     Mental Status: She is alert and oriented to person, place, and time.     Cranial Nerves: No cranial nerve deficit.     Deep Tendon Reflexes: Strength normal. Reflexes normal.  Psychiatric:        Mood and Affect: Mood and affect normal. Mood is not anxious or depressed.     Wt Readings from Last 3 Encounters:  12/18/20 151 lb (68.5 kg)  10/26/20 150 lb (68 kg)  05/04/20 158 lb (71.7 kg)    BP 120/80   Pulse 68   Ht 5\' 6"  (1.676 m)   Wt 151 lb (68.5 kg)   BMI 24.37 kg/m   Assessment and Plan: 1. Irritable bowel syndrome with diarrhea Chronic.  Controlled.  Stable.  Continue cholestyramine 1 packet by mouth 4 g daily. - cholestyramine light (PREVALITE) 4 GM/DOSE powder; Take 1 packet (4 g total) by mouth daily.  Dispense: 378 g; Refill: 0  2. Type 2 diabetes mellitus without complication, without long-term current use of insulin (HCC) Chronic.  Controlled.  Stable.  Continue current regimen of glipizide 10 mg 1 every morning and Metformin XR 750 mg once a day.  Will check A1c and CMP  for current control status. - HgB A1c - Comprehensive Metabolic Panel (CMET) - glipiZIDE (GLUCOTROL) 10 MG tablet; TAKE 1 TABLET BY MOUTH ONCE DAILY BEFORE BREAKFAST  Dispense: 90 tablet; Refill: 1 - metFORMIN (GLUCOPHAGE-XR) 750 MG 24 hr tablet; TAKE 1 TABLET BY MOUTH EVERY DAY WITH BREAKFAST  Dispense: 90 tablet; Refill: 1  3. Adult hypothyroidism Controlled.  Stable.  Chronic.  Continue levothyroxine 25 mcg daily. - levothyroxine (SYNTHROID) 25  MCG tablet; Take 1 tablet (25 mcg total) by mouth daily.  Dispense: 90 tablet; Refill: 1 - TSH  4. Familial hypercholesterolemia Chronic.  Controlled.  Stable.  Continue with diet and cholestyramine at current dosing of 4 g daily. - Lipid Panel With LDL/HDL Ratio  5. Fatigue, unspecified type New onset.  We will check a TSH for current status of levothyroxine to see if we may need to increase current dosing. - levothyroxine (SYNTHROID) 25 MCG tablet; Take 1 tablet (25 mcg total) by mouth daily.  Dispense: 90 tablet; Refill: 1 - TSH  6. Gastroesophageal reflux disease, unspecified whether esophagitis present Chronic.  Controlled.  Stable.  Continue Protonix 40 mg once a day.  Patient is tolerating this well. - pantoprazole (PROTONIX) 40 MG tablet; Take 1 tablet (40 mg total) by mouth daily.  Dispense: 90 tablet; Refill: 1

## 2020-12-19 DIAGNOSIS — M6281 Muscle weakness (generalized): Secondary | ICD-10-CM | POA: Diagnosis not present

## 2020-12-19 DIAGNOSIS — M25611 Stiffness of right shoulder, not elsewhere classified: Secondary | ICD-10-CM | POA: Diagnosis not present

## 2020-12-19 DIAGNOSIS — Z9889 Other specified postprocedural states: Secondary | ICD-10-CM | POA: Diagnosis not present

## 2020-12-19 DIAGNOSIS — M25511 Pain in right shoulder: Secondary | ICD-10-CM | POA: Diagnosis not present

## 2020-12-19 LAB — COMPREHENSIVE METABOLIC PANEL
ALT: 35 IU/L — ABNORMAL HIGH (ref 0–32)
AST: 30 IU/L (ref 0–40)
Albumin/Globulin Ratio: 2.1 (ref 1.2–2.2)
Albumin: 5 g/dL — ABNORMAL HIGH (ref 3.7–4.7)
Alkaline Phosphatase: 90 IU/L (ref 44–121)
BUN/Creatinine Ratio: 16 (ref 12–28)
BUN: 10 mg/dL (ref 8–27)
Bilirubin Total: 0.5 mg/dL (ref 0.0–1.2)
CO2: 17 mmol/L — ABNORMAL LOW (ref 20–29)
Calcium: 9.6 mg/dL (ref 8.7–10.3)
Chloride: 104 mmol/L (ref 96–106)
Creatinine, Ser: 0.61 mg/dL (ref 0.57–1.00)
GFR calc Af Amer: 103 mL/min/{1.73_m2} (ref >59)
GFR calc non Af Amer: 89 mL/min/{1.73_m2} (ref >59)
Globulin, Total: 2.4 g/dL (ref 1.5–4.5)
Glucose: 139 mg/dL — ABNORMAL HIGH (ref 65–99)
Potassium: 4.3 mmol/L (ref 3.5–5.2)
Sodium: 140 mmol/L (ref 134–144)
Total Protein: 7.4 g/dL (ref 6.0–8.5)

## 2020-12-19 LAB — LIPID PANEL WITH LDL/HDL RATIO
Cholesterol, Total: 221 mg/dL — ABNORMAL HIGH (ref 100–199)
HDL: 61 mg/dL (ref >39)
LDL Chol Calc (NIH): 135 mg/dL — ABNORMAL HIGH (ref 0–99)
LDL/HDL Ratio: 2.2 ratio (ref 0.0–3.2)
Triglycerides: 143 mg/dL (ref 0–149)
VLDL Cholesterol Cal: 25 mg/dL (ref 5–40)

## 2020-12-19 LAB — HEMOGLOBIN A1C
Est. average glucose Bld gHb Est-mCnc: 140 mg/dL
Hgb A1c MFr Bld: 6.5 % — ABNORMAL HIGH (ref 4.8–5.6)

## 2020-12-19 LAB — TSH: TSH: 2.07 u[IU]/mL (ref 0.450–4.500)

## 2020-12-28 DIAGNOSIS — M25611 Stiffness of right shoulder, not elsewhere classified: Secondary | ICD-10-CM | POA: Diagnosis not present

## 2020-12-28 DIAGNOSIS — M6281 Muscle weakness (generalized): Secondary | ICD-10-CM | POA: Diagnosis not present

## 2020-12-28 DIAGNOSIS — Z9889 Other specified postprocedural states: Secondary | ICD-10-CM | POA: Diagnosis not present

## 2020-12-28 DIAGNOSIS — M25511 Pain in right shoulder: Secondary | ICD-10-CM | POA: Diagnosis not present

## 2021-01-02 DIAGNOSIS — M6281 Muscle weakness (generalized): Secondary | ICD-10-CM | POA: Diagnosis not present

## 2021-01-02 DIAGNOSIS — M25511 Pain in right shoulder: Secondary | ICD-10-CM | POA: Diagnosis not present

## 2021-01-02 DIAGNOSIS — Z9889 Other specified postprocedural states: Secondary | ICD-10-CM | POA: Diagnosis not present

## 2021-01-02 DIAGNOSIS — M25611 Stiffness of right shoulder, not elsewhere classified: Secondary | ICD-10-CM | POA: Diagnosis not present

## 2021-01-08 DIAGNOSIS — M6281 Muscle weakness (generalized): Secondary | ICD-10-CM | POA: Diagnosis not present

## 2021-01-08 DIAGNOSIS — M25611 Stiffness of right shoulder, not elsewhere classified: Secondary | ICD-10-CM | POA: Diagnosis not present

## 2021-01-08 DIAGNOSIS — Z9889 Other specified postprocedural states: Secondary | ICD-10-CM | POA: Diagnosis not present

## 2021-01-08 DIAGNOSIS — M25511 Pain in right shoulder: Secondary | ICD-10-CM | POA: Diagnosis not present

## 2021-01-10 DIAGNOSIS — M25611 Stiffness of right shoulder, not elsewhere classified: Secondary | ICD-10-CM | POA: Diagnosis not present

## 2021-01-10 DIAGNOSIS — M25511 Pain in right shoulder: Secondary | ICD-10-CM | POA: Diagnosis not present

## 2021-01-10 DIAGNOSIS — M6281 Muscle weakness (generalized): Secondary | ICD-10-CM | POA: Diagnosis not present

## 2021-01-10 DIAGNOSIS — Z9889 Other specified postprocedural states: Secondary | ICD-10-CM | POA: Diagnosis not present

## 2021-01-13 ENCOUNTER — Other Ambulatory Visit: Payer: Self-pay | Admitting: Family Medicine

## 2021-01-13 DIAGNOSIS — K580 Irritable bowel syndrome with diarrhea: Secondary | ICD-10-CM

## 2021-01-15 DIAGNOSIS — M25511 Pain in right shoulder: Secondary | ICD-10-CM | POA: Diagnosis not present

## 2021-01-15 DIAGNOSIS — M6281 Muscle weakness (generalized): Secondary | ICD-10-CM | POA: Diagnosis not present

## 2021-01-15 DIAGNOSIS — M25611 Stiffness of right shoulder, not elsewhere classified: Secondary | ICD-10-CM | POA: Diagnosis not present

## 2021-01-15 DIAGNOSIS — Z9889 Other specified postprocedural states: Secondary | ICD-10-CM | POA: Diagnosis not present

## 2021-01-17 DIAGNOSIS — Z9889 Other specified postprocedural states: Secondary | ICD-10-CM | POA: Diagnosis not present

## 2021-01-17 DIAGNOSIS — M25511 Pain in right shoulder: Secondary | ICD-10-CM | POA: Diagnosis not present

## 2021-01-17 DIAGNOSIS — M25611 Stiffness of right shoulder, not elsewhere classified: Secondary | ICD-10-CM | POA: Diagnosis not present

## 2021-01-17 DIAGNOSIS — M6281 Muscle weakness (generalized): Secondary | ICD-10-CM | POA: Diagnosis not present

## 2021-01-22 DIAGNOSIS — Z9889 Other specified postprocedural states: Secondary | ICD-10-CM | POA: Diagnosis not present

## 2021-01-22 DIAGNOSIS — M25511 Pain in right shoulder: Secondary | ICD-10-CM | POA: Diagnosis not present

## 2021-01-22 DIAGNOSIS — M25611 Stiffness of right shoulder, not elsewhere classified: Secondary | ICD-10-CM | POA: Diagnosis not present

## 2021-01-22 DIAGNOSIS — M6281 Muscle weakness (generalized): Secondary | ICD-10-CM | POA: Diagnosis not present

## 2021-01-24 DIAGNOSIS — Z9889 Other specified postprocedural states: Secondary | ICD-10-CM | POA: Diagnosis not present

## 2021-01-24 DIAGNOSIS — M6281 Muscle weakness (generalized): Secondary | ICD-10-CM | POA: Diagnosis not present

## 2021-01-24 DIAGNOSIS — M25511 Pain in right shoulder: Secondary | ICD-10-CM | POA: Diagnosis not present

## 2021-01-24 DIAGNOSIS — M25611 Stiffness of right shoulder, not elsewhere classified: Secondary | ICD-10-CM | POA: Diagnosis not present

## 2021-01-28 DIAGNOSIS — Z9889 Other specified postprocedural states: Secondary | ICD-10-CM | POA: Diagnosis not present

## 2021-01-28 DIAGNOSIS — M25511 Pain in right shoulder: Secondary | ICD-10-CM | POA: Diagnosis not present

## 2021-01-28 DIAGNOSIS — M6281 Muscle weakness (generalized): Secondary | ICD-10-CM | POA: Diagnosis not present

## 2021-01-28 DIAGNOSIS — M25611 Stiffness of right shoulder, not elsewhere classified: Secondary | ICD-10-CM | POA: Diagnosis not present

## 2021-01-30 DIAGNOSIS — M25511 Pain in right shoulder: Secondary | ICD-10-CM | POA: Diagnosis not present

## 2021-01-30 DIAGNOSIS — M6281 Muscle weakness (generalized): Secondary | ICD-10-CM | POA: Diagnosis not present

## 2021-01-30 DIAGNOSIS — Z9889 Other specified postprocedural states: Secondary | ICD-10-CM | POA: Diagnosis not present

## 2021-01-30 DIAGNOSIS — M25611 Stiffness of right shoulder, not elsewhere classified: Secondary | ICD-10-CM | POA: Diagnosis not present

## 2021-02-05 DIAGNOSIS — M25511 Pain in right shoulder: Secondary | ICD-10-CM | POA: Diagnosis not present

## 2021-02-14 DIAGNOSIS — Z9889 Other specified postprocedural states: Secondary | ICD-10-CM | POA: Diagnosis not present

## 2021-02-14 DIAGNOSIS — M6281 Muscle weakness (generalized): Secondary | ICD-10-CM | POA: Diagnosis not present

## 2021-02-14 DIAGNOSIS — M25611 Stiffness of right shoulder, not elsewhere classified: Secondary | ICD-10-CM | POA: Diagnosis not present

## 2021-02-14 DIAGNOSIS — M25511 Pain in right shoulder: Secondary | ICD-10-CM | POA: Diagnosis not present

## 2021-03-22 ENCOUNTER — Other Ambulatory Visit: Payer: Self-pay | Admitting: Family Medicine

## 2021-03-22 DIAGNOSIS — Z1231 Encounter for screening mammogram for malignant neoplasm of breast: Secondary | ICD-10-CM

## 2021-03-25 ENCOUNTER — Other Ambulatory Visit: Payer: Self-pay

## 2021-03-25 ENCOUNTER — Ambulatory Visit
Admission: RE | Admit: 2021-03-25 | Discharge: 2021-03-25 | Disposition: A | Discharge status: Home / Self Care | Payer: Medicare Other | Source: Ambulatory Visit | Attending: Family Medicine | Admitting: Family Medicine

## 2021-03-25 DIAGNOSIS — Z1231 Encounter for screening mammogram for malignant neoplasm of breast: Secondary | ICD-10-CM | POA: Insufficient documentation

## 2021-04-09 DIAGNOSIS — M25311 Other instability, right shoulder: Secondary | ICD-10-CM | POA: Diagnosis not present

## 2021-04-13 ENCOUNTER — Other Ambulatory Visit: Payer: Self-pay | Admitting: Family Medicine

## 2021-04-13 DIAGNOSIS — K580 Irritable bowel syndrome with diarrhea: Secondary | ICD-10-CM

## 2021-04-13 NOTE — Telephone Encounter (Signed)
Med refill request from pharmacy for 231 gms  Previous refill dated 12/18/20 : 3 months ago (12/18/2020)  cholestyramine light (PREVALITE) 4 GM/DOSE powder  Take 1 packet (4 g total) by mouth daily.  Dispense: 378 g Refills: 0 Start: 12/18/2020  By:  Juline Patch, MD  Encounter  Notes to pharmacy: Pt has been getting a 378 Gram can and taking 1 tsp every day as directed. Please fill as been getting- Thank you   Please review. Thank you

## 2021-04-22 ENCOUNTER — Telehealth: Payer: Self-pay | Admitting: Family Medicine

## 2021-04-22 NOTE — Telephone Encounter (Signed)
Copied from [place] [id number]. Topic: Medicare AWV >> Apr 22, 2021  1:29 PM Cher Nakai R wrote: Reason for CRM:   Left message for patient to call back and schedule Medicare Annual Wellness Visit (AWV) in office.   If unable to come into the office for AWV,  please offer to do virtually or by telephone.  Last AWV:  10/15/2013  Please schedule at anytime with [place].  40 minute appointment  Any questions, please contact me at [phone]

## 2021-05-16 ENCOUNTER — Other Ambulatory Visit: Payer: Self-pay | Admitting: Family Medicine

## 2021-05-16 DIAGNOSIS — K580 Irritable bowel syndrome with diarrhea: Secondary | ICD-10-CM

## 2021-05-16 NOTE — Telephone Encounter (Signed)
Future visit in 1 month  

## 2021-05-22 DIAGNOSIS — H60543 Acute eczematoid otitis externa, bilateral: Secondary | ICD-10-CM | POA: Diagnosis not present

## 2021-05-22 DIAGNOSIS — H6123 Impacted cerumen, bilateral: Secondary | ICD-10-CM | POA: Diagnosis not present

## 2021-06-13 ENCOUNTER — Other Ambulatory Visit: Payer: Self-pay | Admitting: Family Medicine

## 2021-06-13 DIAGNOSIS — K580 Irritable bowel syndrome with diarrhea: Secondary | ICD-10-CM

## 2021-06-19 ENCOUNTER — Ambulatory Visit: Payer: Medicare Other | Admitting: Family Medicine

## 2021-06-21 ENCOUNTER — Other Ambulatory Visit: Payer: Self-pay | Admitting: Family Medicine

## 2021-06-21 DIAGNOSIS — K580 Irritable bowel syndrome with diarrhea: Secondary | ICD-10-CM

## 2021-06-21 NOTE — Telephone Encounter (Signed)
   Notes to clinic:  REQUEST FOR 90 DAYS PRESCRIPTION.   Requested Prescriptions  Pending Prescriptions Disp Refills   PREVALITE 4 GM/DOSE powder [Pharmacy Med Name: PREVALITE POWDER] 693 g 1    Sig: TAKE 1 TEASPOONFUL BY MOUTH EVERY DAY AS DIRECTED      Cardiovascular:  Antilipid - Bile Acid Sequestrants Failed - 06/21/2021 11:29 AM      Failed - Total Cholesterol in normal range and within 360 days    Cholesterol, Total  Date Value Ref Range Status  12/18/2020 221 (H) 100 - 199 mg/dL Final          Failed - LDL in normal range and within 360 days    LDL Chol Calc (NIH)  Date Value Ref Range Status  12/18/2020 135 (H) 0 - 99 mg/dL Final          Passed - HDL in normal range and within 360 days    HDL  Date Value Ref Range Status  12/18/2020 61 >39 mg/dL Final          Passed - Triglycerides in normal range and within 360 days    Triglycerides  Date Value Ref Range Status  12/18/2020 143 0 - 149 mg/dL Final          Passed - Valid encounter within last 12 months    Recent Outpatient Visits           6 months ago Type 2 diabetes mellitus without complication, without long-term current use of insulin ([place])   [facility] Juline Patch, MD   1 year ago [facility] Juline Patch, MD   1 year ago Type 2 diabetes mellitus without complication, without long-term current use of insulin ([place])   [facility] Juline Patch, MD   1 year ago Type 2 diabetes mellitus without complication, without long-term current use of insulin ([place])   [facility] Juline Patch, MD   2 years ago Type 2 diabetes mellitus without complication, without long-term current use of insulin ([place])   [facility] Juline Patch, MD       Future Appointments             In 2 weeks Juline Patch, MD [facility], [facility]

## 2021-07-10 ENCOUNTER — Ambulatory Visit (INDEPENDENT_AMBULATORY_CARE_PROVIDER_SITE_OTHER): Payer: Medicare Other | Admitting: Family Medicine

## 2021-07-10 ENCOUNTER — Ambulatory Visit
Admission: RE | Admit: 2021-07-10 | Discharge: 2021-07-10 | Disposition: A | Discharge status: Home / Self Care | Payer: Medicare Other | Attending: Family Medicine | Admitting: Family Medicine

## 2021-07-10 ENCOUNTER — Other Ambulatory Visit: Payer: Self-pay

## 2021-07-10 ENCOUNTER — Ambulatory Visit
Admission: RE | Admit: 2021-07-10 | Discharge: 2021-07-10 | Disposition: A | Discharge status: Home / Self Care | Payer: Medicare Other | Source: Ambulatory Visit | Attending: Family Medicine | Admitting: Family Medicine

## 2021-07-10 ENCOUNTER — Encounter: Payer: Self-pay | Admitting: Family Medicine

## 2021-07-10 VITALS — BP 130/74 | HR 80 | Ht 66.0 in | Wt 146.0 lb

## 2021-07-10 DIAGNOSIS — E039 Hypothyroidism, unspecified: Secondary | ICD-10-CM

## 2021-07-10 DIAGNOSIS — K219 Gastro-esophageal reflux disease without esophagitis: Secondary | ICD-10-CM | POA: Diagnosis not present

## 2021-07-10 DIAGNOSIS — R053 Chronic cough: Secondary | ICD-10-CM | POA: Diagnosis not present

## 2021-07-10 DIAGNOSIS — R5383 Other fatigue: Secondary | ICD-10-CM

## 2021-07-10 DIAGNOSIS — E119 Type 2 diabetes mellitus without complications: Secondary | ICD-10-CM | POA: Diagnosis not present

## 2021-07-10 MED ORDER — LEVOTHYROXINE SODIUM 25 MCG PO TABS: 25.0000 ug | ORAL_TABLET | Freq: Every day | ORAL | 1 refills | Status: DC

## 2021-07-10 MED ORDER — MONTELUKAST SODIUM 10 MG PO TABS: 10.0000 mg | ORAL_TABLET | Freq: Every day | ORAL | 3 refills | Status: DC

## 2021-07-10 MED ORDER — METFORMIN HCL ER 750 MG PO TB24: ORAL_TABLET | ORAL | 1 refills | Status: DC

## 2021-07-10 MED ORDER — PANTOPRAZOLE SODIUM 40 MG PO TBEC: 40.0000 mg | DELAYED_RELEASE_TABLET | Freq: Every day | ORAL | 1 refills | Status: DC

## 2021-07-10 NOTE — Progress Notes (Signed)
Date:  07/10/2021   Name:  Rebecca Robbins   DOB:  02-Oct-1946   MRN:  [id number]   Chief Complaint: Diabetes, Hypothyroidism, Gastroesophageal Reflux, and Irritable Bowel Syndrome  Diabetes She presents for her follow-up diabetic visit. She has type 2 diabetes mellitus. Her disease course has been stable. Pertinent negatives for hypoglycemia include no confusion, dizziness, headaches, hunger, mood changes, nervousness/anxiousness, pallor, seizures, sleepiness, speech difficulty, sweats or tremors. Associated symptoms include fatigue. Pertinent negatives for diabetes include no blurred vision, no chest pain, no foot paresthesias, no foot ulcerations, no polydipsia, no polyphagia, no polyuria, no visual change, no weakness and no weight loss. There are no hypoglycemic complications. Symptoms are stable. There are no diabetic complications. There are no known risk factors for coronary artery disease. Current diabetic treatment includes oral agent (monotherapy). She is compliant with treatment most of the time. Her weight is stable. She is following a generally healthy diet. Meal planning includes avoidance of concentrated sweets and carbohydrate counting. She participates in exercise intermittently. Her home blood glucose trend is fluctuating minimally. Her breakfast blood glucose is taken between 8-9 am. Her lunch blood glucose range is generally 130-140 mg/dl. An ACE inhibitor/angiotensin II receptor blocker is not being taken. Eye exam is current.  Hyperlipidemia This is a chronic problem. The current episode started more than 1 year ago. The problem is controlled. Recent lipid tests were reviewed and are normal. She has no history of chronic renal disease, diabetes, hypothyroidism, liver disease, obesity or nephrotic syndrome. Factors aggravating her hyperlipidemia include thiazides. Pertinent negatives include no chest pain, focal sensory loss, focal weakness, leg pain, myalgias or shortness of  breath. Current antihyperlipidemic treatment includes diet change. The current treatment provides mild improvement of lipids. There are no compliance problems.  Risk factors for coronary artery disease include dyslipidemia and hypertension.  Thyroid Problem Presents for follow-up visit. Symptoms include diarrhea and fatigue. Patient reports no anxiety, constipation, depressed mood, palpitations, tremors, visual change, weight gain or weight loss. The symptoms have been stable. Her past medical history is significant for hyperlipidemia. There is no history of diabetes.  GI Problem The primary symptoms include fatigue and diarrhea. Primary symptoms do not include fever, weight loss, abdominal pain, nausea, hematemesis, jaundice, hematochezia, dysuria, myalgias or rash.  The illness does not include chills, dysphagia, constipation or back pain. Associated medical issues do not include liver disease.  Cough This is a new problem. The current episode started more than 1 month ago (6week). The problem has been gradually worsening. The cough is Non-productive. Associated symptoms include nasal congestion, postnasal drip and rhinorrhea. Pertinent negatives include no chest pain, chills, ear congestion, ear pain, fever, headaches, heartburn, hemoptysis, myalgias, rash, sore throat, shortness of breath, sweats, weight loss or wheezing. There is no history of asthma, emphysema or environmental allergies.   Lab Results  Component Value Date   CREATININE 0.61 12/18/2020   BUN 10 12/18/2020   NA 140 12/18/2020   K 4.3 12/18/2020   CL 104 12/18/2020   CO2 17 (L) 12/18/2020   Lab Results  Component Value Date   CHOL 221 (H) 12/18/2020   HDL 61 12/18/2020   LDLCALC 135 (H) 12/18/2020   TRIG 143 12/18/2020   CHOLHDL 3.1 08/23/2018   Lab Results  Component Value Date   TSH 2.070 12/18/2020   Lab Results  Component Value Date   HGBA1C 6.5 (H) 12/18/2020   No results found for: WBC, HGB, HCT, MCV,  PLT Lab Results  Component Value Date   ALT 35 (H) 12/18/2020   AST 30 12/18/2020   ALKPHOS 90 12/18/2020   BILITOT 0.5 12/18/2020     Review of Systems  Constitutional:  Positive for fatigue. Negative for chills, fever, weight gain and weight loss.  HENT:  Positive for postnasal drip and rhinorrhea. Negative for drooling, ear discharge, ear pain and sore throat.   Eyes:  Negative for blurred vision.  Respiratory:  Positive for cough. Negative for hemoptysis, shortness of breath and wheezing.   Cardiovascular:  Negative for chest pain, palpitations and leg swelling.  Gastrointestinal:  Positive for diarrhea. Negative for abdominal pain, blood in stool, constipation, dysphagia, heartburn, hematemesis, hematochezia, jaundice and nausea.  Endocrine: Negative for polydipsia, polyphagia and polyuria.  Genitourinary:  Negative for dysuria, frequency, hematuria and urgency.  Musculoskeletal:  Negative for back pain, myalgias and neck pain.  Skin:  Negative for pallor and rash.  Allergic/Immunologic: Negative for environmental allergies.  Neurological:  Negative for dizziness, tremors, focal weakness, seizures, speech difficulty, weakness and headaches.  Hematological:  Does not bruise/bleed easily.  Psychiatric/Behavioral:  Negative for confusion and suicidal ideas. The patient is not nervous/anxious.    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   Irritable bowel syndrome with diarrhea 03/02/2017   Type 2 diabetes mellitus without complication, without long-term current use of insulin ([place]) 08/28/2016   Esophageal reflux 08/28/2016   Adult hypothyroidism 08/28/2016   Allergic rhinitis due to pollen 08/28/2016   Cephalalgia 11/23/2014    Allergies  Allergen Reactions   Penicillins Other (See Comments)    Does not work for patient    Azithromycin Rash   Hydrocodone-Acetaminophen Nausea Only and Rash    Past Surgical History:  Procedure Laterality Date   BREAST BIOPSY Right 2004   neg   CHOLECYSTECTOMY     COLONOSCOPY  2015   repeat 10 yrs- Dr Allen Norris   MENISCUS REPAIR Bilateral    Right x2, Left x1    Social History   Tobacco Use   Smoking status: Never   Smokeless tobacco: Never  Vaping Use   Vaping Use: Never used  Substance Use Topics   Alcohol use: Yes    Alcohol/week: 3.0 standard drinks    Types: 3 Glasses of wine per week   Drug use: No     Medication list has been reviewed and updated.  Current Meds  Medication Sig   acetaminophen (TYLENOL) 500 MG tablet Take 2 tablets (1,000 mg total) by mouth every 8 (eight) hours.   levothyroxine (SYNTHROID) 25 MCG tablet Take 1 tablet (25 mcg total) by mouth daily.   metFORMIN (GLUCOPHAGE-XR) 750 MG 24 hr tablet TAKE 1 TABLET BY MOUTH EVERY DAY WITH BREAKFAST   pantoprazole (PROTONIX) 40 MG tablet Take 1 tablet (40 mg total) by mouth daily.   PREVALITE 4 GM/DOSE powder TAKE 1 TEASPOONFUL BY MOUTH EVERY DAY AS DIRECTED   triamcinolone (NASACORT) 55 MCG/ACT AERO nasal inhaler Place 2 sprays into the nose daily.    PHQ 2/9 Scores 07/10/2021 12/18/2020 05/04/2020 04/30/2020  PHQ - 2 Score 0 0 0 0  PHQ- 9 Score 0 0 0 0    GAD 7 : Generalized Anxiety Score 07/10/2021 05/04/2020 04/30/2020 11/30/2019  Nervous, Anxious, on Edge 0 0 0 0  Control/stop worrying 0 0 0 0  Worry too much - different things 0 0  0 0  Trouble relaxing 0 0 0 0  Restless 0 0 0 0  Easily annoyed or irritable 0 0 0 0  Afraid - awful might happen 0 0 0 0  Total GAD 7 Score 0 0 0 0  Anxiety Difficulty - - Not difficult at all -    BP Readings from Last 3 Encounters:  07/10/21 130/74  12/18/20 120/80  10/26/20 (!) 142/79    Physical Exam Vitals and nursing note reviewed.  Constitutional:      General: She is not in acute distress.    Appearance: She is not diaphoretic.  HENT:      Head: Normocephalic and atraumatic.     Right Ear: Tympanic membrane, ear canal and external ear normal. There is no impacted cerumen.     Left Ear: Tympanic membrane, ear canal and external ear normal. There is no impacted cerumen.     Nose: Nose normal. No congestion or rhinorrhea.  Eyes:     General:        Right eye: No discharge.        Left eye: No discharge.     Conjunctiva/sclera: Conjunctivae normal.     Pupils: Pupils are equal, round, and reactive to light.  Neck:     Thyroid: No thyromegaly.     Vascular: No JVD.  Cardiovascular:     Rate and Rhythm: Normal rate and regular rhythm.     Heart sounds: Normal heart sounds. No murmur heard.   No friction rub. No gallop.  Pulmonary:     Effort: Pulmonary effort is normal.     Breath sounds: Normal breath sounds. No wheezing, rhonchi or rales.  Abdominal:     General: Bowel sounds are normal.     Palpations: Abdomen is soft. There is no mass.     Tenderness: There is no abdominal tenderness. There is no guarding.  Musculoskeletal:        General: Normal range of motion.     Cervical back: Normal range of motion and neck supple.  Lymphadenopathy:     Cervical: No cervical adenopathy.  Skin:    General: Skin is warm and dry.  Neurological:     Mental Status: She is alert.     Deep Tendon Reflexes: Reflexes are normal and symmetric.    Wt Readings from Last 3 Encounters:  07/10/21 146 lb (66.2 kg)  12/18/20 151 lb (68.5 kg)  10/26/20 150 lb (68 kg)    BP 130/74   Pulse 80   Ht '5\' 6"'$  (1.676 m)   Wt 146 lb (66.2 kg)   BMI 23.57 kg/m   Assessment and Plan:  1. Adult hypothyroidism Chronic.  Controlled.  Stable.  Currently on levothyroxine 25 mcg daily we will check thyroid panel for sufficient supplementation. - Thyroid Panel With TSH - levothyroxine (SYNTHROID) 25 MCG tablet; Take 1 tablet (25 mcg total) by mouth daily.  Dispense: 90 tablet; Refill: 1  2. Fatigue, unspecified type Chronic.  Controlled.   Stable.  Patient is doing better but still some mild fatigue but may be due to multiple factors. - levothyroxine (SYNTHROID) 25 MCG tablet; Take 1 tablet (25 mcg total) by mouth daily.  Dispense: 90 tablet; Refill: 1  3. Type 2 diabetes mellitus without complication, without long-term current use of insulin (HCC) Chronic.  Controlled.  Stable.  Patient stopped her glipizide due to some low normal blood sugars.  We will check a microalbuminuria and A1c and renal function panel for current  level of control we will at this point in time continue her metformin XR 750 mg once a day. - Microalbumin, urine - HgB A1c - Lipid Panel With LDL/HDL Ratio - Renal Function Panel - metFORMIN (GLUCOPHAGE-XR) 750 MG 24 hr tablet; TAKE 1 TABLET BY MOUTH EVERY DAY WITH BREAKFAST  Dispense: 90 tablet; Refill: 1  4. Gastroesophageal reflux disease, unspecified whether esophagitis present Chronic.  Controlled.  Stable.  Continue pantoprazole 40 mg once a day.  Patient is having some postnasal drainage no real dysphagia at this time. - pantoprazole (PROTONIX) 40 MG tablet; Take 1 tablet (40 mg total) by mouth daily.  Dispense: 90 tablet; Refill: 1  5. Chronic cough Patient's had a chronic cough and then which has been going on for several weeks which may be allergy related and it seems that that there is had epiglottic aspect to this.  We will do a chest x-ray to take a look to make sure that there is no post COVID or cardiopulmonary concern.  In the meantime we will initiate Singulair 10 mg once a day. - montelukast (SINGULAIR) 10 MG tablet; Take 1 tablet (10 mg total) by mouth at bedtime.  Dispense: 30 tablet; Refill: 3 - DG Chest 2 View; Future

## 2021-07-11 ENCOUNTER — Other Ambulatory Visit: Payer: Self-pay

## 2021-07-11 DIAGNOSIS — E119 Type 2 diabetes mellitus without complications: Secondary | ICD-10-CM

## 2021-07-11 LAB — HEMOGLOBIN A1C
Est. average glucose Bld gHb Est-mCnc: 154 mg/dL
Hgb A1c MFr Bld: 7 % — ABNORMAL HIGH (ref 4.8–5.6)

## 2021-07-11 LAB — RENAL FUNCTION PANEL
Albumin: 4.8 g/dL — ABNORMAL HIGH (ref 3.7–4.7)
BUN/Creatinine Ratio: 15 (ref 12–28)
BUN: 11 mg/dL (ref 8–27)
CO2: 18 mmol/L — ABNORMAL LOW (ref 20–29)
Calcium: 10 mg/dL (ref 8.7–10.3)
Chloride: 102 mmol/L (ref 96–106)
Creatinine, Ser: 0.71 mg/dL (ref 0.57–1.00)
Glucose: 150 mg/dL — ABNORMAL HIGH (ref 65–99)
Phosphorus: 3.8 mg/dL (ref 3.0–4.3)
Potassium: 4.4 mmol/L (ref 3.5–5.2)
Sodium: 138 mmol/L (ref 134–144)
eGFR: 88 mL/min/{1.73_m2} (ref >59)

## 2021-07-11 LAB — THYROID PANEL WITH TSH
Free Thyroxine Index: 2.1 (ref 1.2–4.9)
T3 Uptake Ratio: 24 % (ref 24–39)
T4, Total: 8.8 ug/dL (ref 4.5–12.0)
TSH: 2.77 u[IU]/mL (ref 0.450–4.500)

## 2021-07-11 LAB — LIPID PANEL WITH LDL/HDL RATIO
Cholesterol, Total: 218 mg/dL — ABNORMAL HIGH (ref 100–199)
HDL: 62 mg/dL (ref >39)
LDL Chol Calc (NIH): 130 mg/dL — ABNORMAL HIGH (ref 0–99)
LDL/HDL Ratio: 2.1 ratio (ref 0.0–3.2)
Triglycerides: 147 mg/dL (ref 0–149)
VLDL Cholesterol Cal: 26 mg/dL (ref 5–40)

## 2021-07-11 LAB — MICROALBUMIN, URINE: Microalbumin, Urine: 10.8 ug/mL

## 2021-07-11 MED ORDER — GLIPIZIDE ER 2.5 MG PO TB24: 2.5000 mg | ORAL_TABLET | Freq: Every day | ORAL | 0 refills | Status: DC

## 2021-07-11 NOTE — Progress Notes (Signed)
Sent in glipizide xl 2.'5mg'$ 

## 2021-07-23 ENCOUNTER — Telehealth: Payer: Self-pay

## 2021-07-23 ENCOUNTER — Other Ambulatory Visit: Payer: Self-pay

## 2021-07-23 ENCOUNTER — Encounter: Payer: Self-pay | Admitting: Family Medicine

## 2021-07-23 ENCOUNTER — Telehealth (INDEPENDENT_AMBULATORY_CARE_PROVIDER_SITE_OTHER): Payer: Medicare Other | Admitting: Family Medicine

## 2021-07-23 ENCOUNTER — Ambulatory Visit: Payer: Self-pay | Admitting: *Deleted

## 2021-07-23 VITALS — Temp 97.9°F

## 2021-07-23 DIAGNOSIS — R059 Cough, unspecified: Secondary | ICD-10-CM | POA: Diagnosis not present

## 2021-07-23 DIAGNOSIS — U071 COVID-19: Secondary | ICD-10-CM

## 2021-07-23 MED ORDER — MOLNUPIRAVIR EUA 200MG CAPSULE: 4.0000 | ORAL_CAPSULE | Freq: Two times a day (BID) | ORAL | 0 refills | Status: AC

## 2021-07-23 MED ORDER — GUAIFENESIN-DM 100-10 MG/5ML PO SYRP: 5.0000 mL | ORAL_SOLUTION | ORAL | 0 refills | Status: DC | PRN

## 2021-07-23 NOTE — Progress Notes (Addendum)
Date:  07/23/2021   Name:  Rebecca Robbins   DOB:  1946/09/16   MRN:  [id number]   Chief Complaint: Covid Positive (Symptoms started Thursday- took COVID yesterday and was positive. Cough, headache, nausea, diarrhea, no fever)  I Army Fossa, MD from my office connected with this patient, Rebecca Robbins, by telephone at the patient's home.  I verified that I am speaking with the correct person using two identifiers. This visit was conducted via telephone due to the Covid-19 outbreak from my office at [facility] in [place], [place]. I discussed the limitations, risks, security and privacy concerns of performing an evaluation and management service by telephone. I also discussed with the patient that there may be a patient responsible charge related to this service. The patient expressed understanding and agreed to proceed. COVID risk assessment is a 3.   Lab Results  Component Value Date   CREATININE 0.71 07/10/2021   BUN 11 07/10/2021   NA 138 07/10/2021   K 4.4 07/10/2021   CL 102 07/10/2021   CO2 18 (L) 07/10/2021   Lab Results  Component Value Date   CHOL 218 (H) 07/10/2021   HDL 62 07/10/2021   LDLCALC 130 (H) 07/10/2021   TRIG 147 07/10/2021   CHOLHDL 3.1 08/23/2018   Lab Results  Component Value Date   TSH 2.770 07/10/2021   Lab Results  Component Value Date   HGBA1C 7.0 (H) 07/10/2021   No results found for: WBC, HGB, HCT, MCV, PLT Lab Results  Component Value Date   ALT 35 (H) 12/18/2020   AST 30 12/18/2020   ALKPHOS 90 12/18/2020   BILITOT 0.5 12/18/2020     Review of Systems  Constitutional:  Positive for chills, diaphoresis, fatigue and fever.  HENT:  Positive for congestion.   Respiratory:  Positive for cough.   Neurological:  Positive for headaches.  All other systems reviewed and are negative.  Patient Active Problem List   Diagnosis Date Noted   Recurrent major depressive disorder, in partial remission ([place]) 01/06/2018   Abnormal  mammogram 03/02/2017   Pure hyperglyceridemia 03/02/2017   Irritable bowel syndrome with diarrhea 03/02/2017   Type 2 diabetes mellitus without complication, without long-term current use of insulin ([place]) 08/28/2016   Esophageal reflux 08/28/2016   Adult hypothyroidism 08/28/2016   Allergic rhinitis due to pollen 08/28/2016   Cephalalgia 11/23/2014    Allergies  Allergen Reactions   Penicillins Other (See Comments)    Does not work for patient   Azithromycin Rash   Hydrocodone-Acetaminophen Nausea Only and Rash    Past Surgical History:  Procedure Laterality Date   BREAST BIOPSY Right 2004   neg   CHOLECYSTECTOMY     COLONOSCOPY  2015   repeat 10 yrs- Dr Allen Norris   MENISCUS REPAIR Bilateral    Right x2, Left x1    Social History   Tobacco Use   Smoking status: Never   Smokeless tobacco: Never  Vaping Use   Vaping Use: Never used  Substance Use Topics   Alcohol use: Yes    Alcohol/week: 3.0 standard drinks    Types: 3 Glasses of wine per week   Drug use: No     Medication list has been reviewed and updated.  Current Meds  Medication Sig   acetaminophen (TYLENOL) 500 MG tablet Take 2 tablets (1,000 mg total) by mouth every 8 (eight) hours.   glipiZIDE (GLUCOTROL XL) 2.5 MG 24 hr tablet Take 1 tablet (2.5 mg  total) by mouth daily with breakfast.   levothyroxine (SYNTHROID) 25 MCG tablet Take 1 tablet (25 mcg total) by mouth daily.   metFORMIN (GLUCOPHAGE-XR) 750 MG 24 hr tablet TAKE 1 TABLET BY MOUTH EVERY DAY WITH BREAKFAST   montelukast (SINGULAIR) 10 MG tablet Take 1 tablet (10 mg total) by mouth at bedtime.   pantoprazole (PROTONIX) 40 MG tablet Take 1 tablet (40 mg total) by mouth daily.   PREVALITE 4 GM/DOSE powder TAKE 1 TEASPOONFUL BY MOUTH EVERY DAY AS DIRECTED   triamcinolone (NASACORT) 55 MCG/ACT AERO nasal inhaler Place 2 sprays into the nose daily.    PHQ 2/9 Scores 07/23/2021 07/10/2021 12/18/2020 05/04/2020  PHQ - 2 Score 0 0 0 0  PHQ- 9 Score 0 0 0 0     GAD 7 : Generalized Anxiety Score 07/23/2021 07/10/2021 05/04/2020 04/30/2020  Nervous, Anxious, on Edge 0 0 0 0  Control/stop worrying 0 0 0 0  Worry too much - different things 0 0 0 0  Trouble relaxing 0 0 0 0  Restless 0 0 0 0  Easily annoyed or irritable 0 0 0 0  Afraid - awful might happen 0 0 0 0  Total GAD 7 Score 0 0 0 0  Anxiety Difficulty - - - Not difficult at all    BP Readings from Last 3 Encounters:  07/10/21 130/74  12/18/20 120/80  10/26/20 (!) 142/79    Physical Exam Nursing note reviewed.    Wt Readings from Last 3 Encounters:  07/10/21 146 lb (66.2 kg)  12/18/20 151 lb (68.5 kg)  10/26/20 150 lb (68 kg)    Temp 97.9 F (36.6 C) (Oral)   Assessment and Plan:  1. COVID New problem.  Persistent.  Relatively stable.  Risk measure 3.  Patient with persistent cough otherwise no fever no shortness of breath or change in mental status.  Further symptoms have been reviewed with patient as to expectation and discussion of symptoms of concern that would lead to further evaluation in the hospital discussed as well.  Patient will be started on molnupiravir 800 mg twice a day for 5 days.  Patient is to call or to go to the ER if there is change in status. - molnupiravir EUA 200 mg CAPS; Take 4 capsules (800 mg total) by mouth 2 (two) times daily for 5 days.  Dispense: 40 capsule; Refill: 0  2. Cough New onset.  Persistent.  Stable.  Not associated with shortness of breath or fever.  We will control symptoms with Robitussin-DM and patient if develops any of the above concerns will call or go to the nearest emergency facility. - guaiFENesin-dextromethorphan (ROBITUSSIN DM) 100-10 MG/5ML syrup; Take 5 mLs by mouth every 4 (four) hours as needed for cough.  Dispense: 118 mL; Refill: 0   I spent 10 minutes with this patient, More than 50% of that time was spent in face to face education, counseling and care coordination.

## 2021-07-23 NOTE — Telephone Encounter (Signed)
Reason for Disposition  [1] HIGH RISK for severe COVID complications (e.g., weak immune system, age > 58 years, obesity with BMI > 25, pregnant, chronic lung disease or other chronic medical condition) AND [2] COVID symptoms (e.g., cough, fever)  (Exceptions: Already seen by PCP and no new or worsening symptoms.)  Answer Assessment - Initial Assessment Questions 1. COVID-19 DIAGNOSIS: "Who made your COVID-19 diagnosis?" "Was it confirmed by a positive lab test or self-test?" If not diagnosed by a doctor (or NP/PA), ask "Are there lots of cases (community spread) where you live?" Note: See public health department website, if unsure.     07/22/21- + home test 2. COVID-19 EXPOSURE: "Was there any known exposure to COVID before the symptoms began?" CDC Definition of close contact: within 6 feet (2 meters) for a total of 15 minutes or more over a 24-hour period.      Possibly from daughter 36. ONSET: "When did the COVID-19 symptoms start?"      Last Thursday evening 4. WORST SYMPTOM: "What is your worst symptom?" (e.g., cough, fever, shortness of breath, muscle aches)     Headache- nausea, cough 5. COUGH: "Do you have a cough?" If Yes, ask: "How bad is the cough?"       Cough- spell/spasms 6. FEVER: "Do you have a fever?" If Yes, ask: "What is your temperature, how was it measured, and when did it start?"     2 days-Fri/Sat- but not now 7. RESPIRATORY STATUS: "Describe your breathing?" (e.g., shortness of breath, wheezing, unable to speak)      Some throat irritation- slight SOB with exertion 8. BETTER-SAME-WORSE: "Are you getting better, staying the same or getting worse compared to yesterday?"  If getting worse, ask, "In what way?"     better 9. HIGH RISK DISEASE: "Do you have any chronic medical problems?" (e.g., asthma, heart or lung disease, weak immune system, obesity, etc.)     Age, diabetes 10. VACCINE: "Have you had the COVID-19 vaccine?" If Yes, ask: "Which one, how many shots, when did you  get it?"       Yes- Pfizer  11. BOOSTER: "Have you received your COVID-19 booster?" If Yes, ask: "Which one and when did you get it?"       Moderna- 6 months 12. PREGNANCY: "Is there any chance you are pregnant?" "When was your last menstrual period?"       N/a 13. OTHER SYMPTOMS: "Do you have any other symptoms?"  (e.g., chills, fatigue, headache, loss of smell or taste, muscle pain, sore throat)       Weakness, dizziness, body pain(better) 14. O2 SATURATION MONITOR:  "Do you use an oxygen saturation monitor (pulse oximeter) at home?" If Yes, ask "What is your reading (oxygen level) today?" "What is your usual oxygen saturation reading?" (e.g., 95%)       N/a  Protocols used: Coronavirus (COVID-19) Diagnosed or Suspected-A-AH

## 2021-07-23 NOTE — Telephone Encounter (Signed)
Patient is calling to report she has tested + COVID- home test yesterday. Patient states her symptoms started Thursday evening- and she has headache that she is having trouble getting relief from. Patient reports she does have nausea and cough- but that is some better. Patient advised per COVID protocol and advised would send call to office for provider review and possible treatment options. Patient to expect call back.

## 2021-07-23 NOTE — Telephone Encounter (Signed)
Copied from [place] [id number]. Topic: General - Other >> Jul 23, 2021  8:01 AM Alanda Slim E wrote: Reason for CRM: pt wanted to let Baxter Flattery know she has tested positive for covid and wants to know what to do/ pt is experiencing diarrhea, cough, severe headache, body aches and nausea / please advise

## 2021-07-23 NOTE — Telephone Encounter (Signed)
Scheduled at 1120 this AM for telephone visit.

## 2021-07-23 NOTE — Patient Instructions (Signed)
COVID-19: What to Do if You Are Sick CDC has updated isolation and quarantine recommendations for the public, and is revising the CDC website to reflect these changes. These recommendations do not apply to healthcare personnel and do not supersede state, local, tribal, or territorial laws, rules, andregulations. If you have a fever, cough or other symptoms, you might have COVID-19. Most people have mild illness and are able to recover at home. If you are sick: Keep track of your symptoms. If you have an emergency warning sign (including trouble breathing), call 911. Steps to help prevent the spread of COVID-19 if you are sick If you are sick with COVID-19 or think you might have COVID-19, follow the steps below to care for yourself and to help protect other peoplein your home and community. Stay home except to get medical care Stay home. Most people with COVID-19 have mild illness and can recover at home without medical care. Do not leave your home, except to get medical care. Do not visit public areas. Take care of yourself. Get rest and stay hydrated. Take over-the-counter medicines, such as acetaminophen, to help you feel better. Stay in touch with your doctor. Call before you get medical care. Be sure to get care if you have trouble breathing, or have any other emergency warning signs, or if you think it is an emergency. Avoid public transportation, ride-sharing, or taxis. Separate yourself from other people As much as possible, stay in a specific room and away from other people and pets in your home. If possible, you should use a separate bathroom. If you need to be around other people or animals in oroutside of the home, wear a mask. Tell your close contactsthat they may have been exposed to COVID-19. An infected person can spread COVID-19 starting 48 hours (or 2 days) before the person has any symptoms or tests positive. By letting your close contacts know they may have been exposed to COVID-19,  you are helping to protect everyone. Additional guidance is available for those living in close quarters and shared housing. See COVID-19 and Animals if you have questions about pets. If you are diagnosed with COVID-19, someone from the health department may call you. Answer the call to slow the spread. Monitor your symptoms Symptoms of COVID-19 include fever, cough, or other symptoms. Follow care instructions from your healthcare provider and local health department. Your local health authorities may give instructions on checking your symptoms and reporting information. When to seek emergency medical attention Look for emergency warning signs* for COVID-19. If someone is showing any of these signs, seek emergency medical care immediately: Trouble breathing Persistent pain or pressure in the chest New confusion Inability to wake or stay awake Pale, gray, or blue-colored skin, lips, or nail beds, depending on skin tone *This list is not all possible symptoms. Please call your medical provider forany other symptoms that are severe or concerning to you. Call 911 or call ahead to your local emergency facility: Notify the operator that you are seeking care for someone who has or may haveCOVID-19. Call ahead before visiting your doctor Call ahead. Many medical visits for routine care are being postponed or done by phone or telemedicine. If you have a medical appointment that cannot be postponed, call your doctor's office, and tell them you have or may have COVID-19. This will help the office protect themselves and other patients. Get tested If you have symptoms of COVID-19, get tested. While waiting for test results, you stay away from others,  including staying apart from those living in your household. Self-tests are one of several options for testing for the virus that causes COVID-19 and may be more convenient than laboratory-based tests and point-of-care tests. Ask your healthcare provider or  your local health department if you need help interpreting your test results. You can visit your state, tribal, local, and territorial health department's website to look for the latest local information on testing sites. If you are sick, wear a mask over your nose and mouth You should wear a mask over your nose and mouth if you must be around other people or animals, including pets (even at home). You don't need to wear the mask if you are alone. If you can't put on a mask (because of trouble breathing, for example), cover your coughs and sneezes in some other way. Try to stay at least 6 feet away from other people. This will help protect the people around you. Masks should not be placed on young children under age 47 years, anyone who has trouble breathing, or anyone who is not able to remove the mask without help. Note: During the COVID-19 pandemic, medical grade facemasks are reserved forhealthcare workers and some first responders. Cover your coughs and sneezes Cover your mouth and nose with a tissue when you cough or sneeze. Throw away used tissues in a lined trash can. Immediately wash your hands with soap and water for at least 20 seconds. If soap and water are not available, clean your hands with an alcohol-based hand sanitizer that contains at least 60% alcohol. Clean your hands often Wash your hands often with soap and water for at least 20 seconds. This is especially important after blowing your nose, coughing, or sneezing; going to the bathroom; and before eating or preparing food. Use hand sanitizer if soap and water are not available. Use an alcohol-based hand sanitizer with at least 60% alcohol, covering all surfaces of your hands and rubbing them together until they feel dry. Soap and water are the best option, especially if hands are visibly dirty. Avoid touching your eyes, nose, and mouth with unwashed hands. Handwashing Tips Avoid sharing personal household items Do not share  dishes, drinking glasses, cups, eating utensils, towels, or bedding with other people in your home. Wash these items thoroughly after using them with soap and water or put in the dishwasher. Clean all "high-touch" surfaces every day Clean and disinfect high-touch surfaces in your "sick room" and bathroom; wear disposable gloves. Let someone else clean and disinfect surfaces in common areas, but you should clean your bedroom and bathroom, if possible. If a caregiver or other person needs to clean and disinfect a sick person's bedroom or bathroom, they should do so on an as-needed basis. The caregiver/other person should wear a mask and disposable gloves prior to cleaning. They should wait as long as possible after the person who is sick has used the bathroom before coming in to clean and use the bathroom. High-touch surfaces include phones, remote controls, counters, tabletops, doorknobs, bathroom fixtures, toilets, keyboards, tablets, and bedside tables. Clean and disinfect areas that may have blood, stool, or body fluids on them. Use household cleaners and disinfectants. Clean the area or item with soap and water or another detergent if it is dirty. Then, use a household disinfectant. Be sure to follow the instructions on the label to ensure safe and effective use of the product. Many products recommend keeping the surface wet for several minutes to ensure germs are killed. Many  also recommend precautions such as wearing gloves and making sure you have good ventilation during use of the product. Use a product from [employer] List N: Disinfectants for Coronavirus ([id number]). Complete Disinfection Guidance When you can be around others after being sick with COVID-19 Deciding when you can be around others is different for different situations. Find out when you can safely end home isolation. For any additional questions about your care,contact your healthcare provider or state or local health  department. 11/21/2020 Content source: [facility] for Immunization and Respiratory Diseases ([facility]), Division of Viral Diseases This information is not intended to replace advice given to you by your health care provider. Make sure you discuss any questions you have with your healthcare provider. Document Revised: 01/18/2021 Document Reviewed: 01/18/2021 Elsevier Patient Education  [place].

## 2021-09-09 ENCOUNTER — Telehealth: Payer: Self-pay | Admitting: Family Medicine

## 2021-09-09 NOTE — Telephone Encounter (Signed)
Copied from [place] [id number]. Topic: Medicare AWV >> Sep 09, 2021  5:00 PM Weston Anna wrote: Left message for patient to call back and schedule Medicare Annual Wellness Visit (AWV) in office.   If unable to come into the office for AWV,  please offer to do virtually or by telephone.   Please schedule at anytime with [facility] Health Advisor.  40 minute appointment  Any questions, please contact me at [phone]    Reason for CRM:

## 2021-09-29 ENCOUNTER — Other Ambulatory Visit: Payer: Self-pay | Admitting: Family Medicine

## 2021-09-29 DIAGNOSIS — K580 Irritable bowel syndrome with diarrhea: Secondary | ICD-10-CM

## 2021-09-29 NOTE — Telephone Encounter (Signed)
Requested medication (s) are due for refill today: yes  Requested medication (s) are on the active medication list: yes  Last refill:  06/24/21   Future visit scheduled: yes  Notes to clinic:  labs are are due - appt 11/22   Requested Prescriptions  Pending Prescriptions Disp Refills   PREVALITE 4 GM/DOSE powder [Pharmacy Med Name: PREVALITE POWDER] 231 g 0    Sig: TAKE 1 TEASPOONFUL BY MOUTH EVERY DAY AS DIRECTED     Cardiovascular:  Antilipid - Bile Acid Sequestrants Failed - 09/29/2021  9:05 AM      Failed - Total Cholesterol in normal range and within 360 days    Cholesterol, Total  Date Value Ref Range Status  07/10/2021 218 (H) 100 - 199 mg/dL Final          Failed - LDL in normal range and within 360 days    LDL Chol Calc (NIH)  Date Value Ref Range Status  07/10/2021 130 (H) 0 - 99 mg/dL Final          Passed - HDL in normal range and within 360 days    HDL  Date Value Ref Range Status  07/10/2021 62 >39 mg/dL Final          Passed - Triglycerides in normal range and within 360 days    Triglycerides  Date Value Ref Range Status  07/10/2021 147 0 - 149 mg/dL Final          Passed - Valid encounter within last 12 months    Recent Outpatient Visits           2 months ago [place], Deanna C, MD   2 months ago Type 2 diabetes mellitus without complication, without long-term current use of insulin ([place])   [facility] Juline Patch, MD   9 months ago Type 2 diabetes mellitus without complication, without long-term current use of insulin ([place])   [facility] Juline Patch, MD   1 year ago [facility] Juline Patch, MD   1 year ago Type 2 diabetes mellitus without complication, without long-term current use of insulin ([place])   [facility] Juline Patch, MD       Future Appointments             In 1 month Juline Patch, MD [facility], [facility]

## 2021-10-11 ENCOUNTER — Other Ambulatory Visit: Payer: Self-pay | Admitting: Family Medicine

## 2021-10-11 DIAGNOSIS — R053 Chronic cough: Secondary | ICD-10-CM

## 2021-10-11 DIAGNOSIS — E119 Type 2 diabetes mellitus without complications: Secondary | ICD-10-CM

## 2021-10-15 DIAGNOSIS — Z23 Encounter for immunization: Secondary | ICD-10-CM | POA: Diagnosis not present

## 2021-10-28 ENCOUNTER — Other Ambulatory Visit: Payer: Self-pay | Admitting: Family Medicine

## 2021-10-28 DIAGNOSIS — K580 Irritable bowel syndrome with diarrhea: Secondary | ICD-10-CM

## 2021-10-31 DIAGNOSIS — Z23 Encounter for immunization: Secondary | ICD-10-CM | POA: Diagnosis not present

## 2021-11-05 ENCOUNTER — Other Ambulatory Visit: Payer: Self-pay

## 2021-11-05 ENCOUNTER — Encounter: Payer: Self-pay | Admitting: Family Medicine

## 2021-11-05 ENCOUNTER — Ambulatory Visit (INDEPENDENT_AMBULATORY_CARE_PROVIDER_SITE_OTHER): Payer: Medicare Other | Admitting: Family Medicine

## 2021-11-05 VITALS — BP 124/64 | HR 100 | Ht 66.0 in | Wt 146.0 lb

## 2021-11-05 DIAGNOSIS — R8290 Unspecified abnormal findings in urine: Secondary | ICD-10-CM

## 2021-11-05 DIAGNOSIS — N3090 Cystitis, unspecified without hematuria: Secondary | ICD-10-CM

## 2021-11-05 LAB — POCT URINALYSIS DIPSTICK
Bilirubin, UA: NEGATIVE
Glucose, UA: NEGATIVE
Ketones, UA: NEGATIVE
Nitrite, UA: NEGATIVE
Protein, UA: NEGATIVE
Spec Grav, UA: <=1.005 — AB (ref 1.010–1.025)
Urobilinogen, UA: 0.2 E.U./dL
pH, UA: 5 (ref 5.0–8.0)

## 2021-11-05 MED ORDER — FLUCONAZOLE 150 MG PO TABS: 150.0000 mg | ORAL_TABLET | Freq: Once | ORAL | 0 refills | Status: AC

## 2021-11-05 MED ORDER — CEPHALEXIN 500 MG PO CAPS: 500.0000 mg | ORAL_CAPSULE | Freq: Three times a day (TID) | ORAL | 0 refills | Status: DC

## 2021-11-05 NOTE — Progress Notes (Signed)
Date:  11/05/2021   Name:  Rebecca Robbins   DOB:  09-20-1946   MRN:  [id number]   Chief Complaint: UTI symptoms (Started yesterday, burning, frequency, urgency, dysuria)  Urinary Tract Infection  This is a new problem. The current episode started yesterday. The problem has been gradually worsening. The quality of the pain is described as burning. The pain is at a severity of 3/10. The pain is moderate. There has been no fever. Associated symptoms include frequency and urgency. Pertinent negatives include no chills, discharge, flank pain, hematuria, hesitancy, nausea, sweats or vomiting.   Lab Results  Component Value Date   CREATININE 0.71 07/10/2021   BUN 11 07/10/2021   NA 138 07/10/2021   K 4.4 07/10/2021   CL 102 07/10/2021   CO2 18 (L) 07/10/2021   Lab Results  Component Value Date   CHOL 218 (H) 07/10/2021   HDL 62 07/10/2021   LDLCALC 130 (H) 07/10/2021   TRIG 147 07/10/2021   CHOLHDL 3.1 08/23/2018   Lab Results  Component Value Date   TSH 2.770 07/10/2021   Lab Results  Component Value Date   HGBA1C 7.0 (H) 07/10/2021   No results found for: WBC, HGB, HCT, MCV, PLT Lab Results  Component Value Date   ALT 35 (H) 12/18/2020   AST 30 12/18/2020   ALKPHOS 90 12/18/2020   BILITOT 0.5 12/18/2020   No components found for: VITD  Review of Systems  Constitutional:  Negative for chills and fever.  HENT:  Negative for drooling, ear discharge, ear pain and sore throat.   Respiratory:  Negative for cough, shortness of breath and wheezing.   Cardiovascular:  Negative for chest pain, palpitations and leg swelling.  Gastrointestinal:  Negative for abdominal pain, blood in stool, constipation, diarrhea, nausea and vomiting.  Endocrine: Negative for polydipsia.  Genitourinary:  Positive for frequency and urgency. Negative for dysuria, flank pain, hematuria and hesitancy.  Musculoskeletal:  Negative for back pain, myalgias and neck pain.  Skin:  Negative for rash.   Allergic/Immunologic: Negative for environmental allergies.  Neurological:  Negative for dizziness and headaches.  Hematological:  Does not bruise/bleed easily.  Psychiatric/Behavioral:  Negative for suicidal ideas. The patient is not nervous/anxious.    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    Allergies  Allergen Reactions   Azithromycin Rash   Hydrocodone-Acetaminophen Nausea Only and Rash   Penicillins Other (See Comments)    Does not work for patient    Past Surgical History:  Procedure Laterality Date   BREAST BIOPSY Right 2004   neg   CHOLECYSTECTOMY     COLONOSCOPY  2015   repeat 10 yrs- Dr Allen Norris   MENISCUS REPAIR Bilateral    Right x2, Left x1    Social History   Tobacco Use   Smoking status: Never   Smokeless tobacco: Never  Vaping Use   Vaping Use: Never used  Substance Use Topics   Alcohol use: Yes    Alcohol/week: 3.0 standard drinks    Types: 3 Glasses of wine per  week   Drug use: No     Medication list has been reviewed and updated.  Current Meds  Medication Sig   glipiZIDE (GLUCOTROL XL) 2.5 MG 24 hr tablet TAKE 1 TABLET BY MOUTH DAILY WITH BREAKFAST.   levothyroxine (SYNTHROID) 25 MCG tablet Take 1 tablet (25 mcg total) by mouth daily.   metFORMIN (GLUCOPHAGE-XR) 750 MG 24 hr tablet TAKE 1 TABLET BY MOUTH EVERY DAY WITH BREAKFAST   pantoprazole (PROTONIX) 40 MG tablet Take 1 tablet (40 mg total) by mouth daily.   PREVALITE 4 GM/DOSE powder TAKE 1 TEASPOONFUL BY  MOUTH EVERY DAY AS DIRECTED   triamcinolone (NASACORT) 55 MCG/ACT AERO nasal inhaler Place 2 sprays into the nose daily.    PHQ 2/9 Scores 11/05/2021 07/23/2021 07/10/2021 12/18/2020  PHQ - 2 Score 0 0 0 0  PHQ- 9 Score 2 0 0 0    GAD 7 : Generalized Anxiety Score 11/05/2021 07/23/2021 07/10/2021 05/04/2020  Nervous, Anxious, on Edge 0 0 0 0  Control/stop worrying 0 0 0 0  Worry too much - different things 0 0 0 0  Trouble relaxing 0 0 0 0  Restless 0 0 0 0  Easily annoyed or irritable 0 0 0 0  Afraid - awful might happen 0 0 0 0  Total GAD 7 Score 0 0 0 0  Anxiety Difficulty Not difficult at all - - -    BP Readings from Last 3 Encounters:  11/05/21 124/64  07/10/21 130/74  12/18/20 120/80    Physical Exam Vitals and nursing note reviewed.  Constitutional:      General: She is not in acute distress.    Appearance: She is not diaphoretic.  HENT:     Head: Normocephalic and atraumatic.     Right Ear: Tympanic membrane and external ear normal.     Left Ear: Tympanic membrane and external ear normal.     Nose: Nose normal.  Eyes:     General:        Right eye: No discharge.        Left eye: No discharge.     Conjunctiva/sclera: Conjunctivae normal.     Pupils: Pupils are equal, round, and reactive to light.  Neck:     Thyroid: No thyromegaly.     Vascular: No JVD.  Cardiovascular:     Rate and Rhythm: Normal rate and regular rhythm.     Heart sounds: Normal heart sounds. No murmur heard.   No friction rub. No gallop.  Pulmonary:     Effort: Pulmonary effort is normal.     Breath sounds: Normal breath sounds.  Abdominal:     General: Bowel sounds are normal.     Palpations: Abdomen is soft. There is no mass.     Tenderness: There is abdominal tenderness in the suprapubic area. There is no right CVA tenderness, left CVA tenderness or guarding.    Musculoskeletal:        General: Normal range of motion.     Cervical back: Normal range of motion and neck supple.   Lymphadenopathy:     Cervical: No cervical adenopathy.  Skin:    General: Skin is warm and dry.  Neurological:     Mental Status: She is alert.     Deep Tendon Reflexes: Reflexes are normal and symmetric.    Wt Readings from Last 3 Encounters:  11/05/21 146 lb (66.2 kg)  07/10/21 146 lb (66.2 kg)  12/18/20 151 lb (68.5 kg)  BP 124/64 (BP Location: Left Arm, Patient Position: Sitting, Cuff Size: Normal)   Pulse 100   Ht 5\' 6"  (1.676 m)   Wt 146 lb (66.2 kg)   SpO2 97%   BMI 23.57 kg/m   Assessment and Plan:  1. Cystitis New onset.  Onset yesterday with frequency urgency and dysuria.  Urinalysis notes red cells and moderate leukocytes.  Exam is consistent with cystitis with tenderness in the suprapubic area with no CVA tenderness.  We will treat with cephalexin 500 mg 3 times a day for 3 days and urine has been sent for culture given the circumstances of the holidays if we need to make a change. - cephALEXin (KEFLEX) 500 MG capsule; Take 1 capsule (500 mg total) by mouth 3 (three) times daily.  Dispense: 9 capsule; Refill: 0 - fluconazole (DIFLUCAN) 150 MG tablet; Take 1 tablet (150 mg total) by mouth once for 1 dose.  Dispense: 1 tablet; Refill: 0 - POCT Urinalysis Dipstick - Urine Culture  2. Abnormal urinalysis Patient has noticed dysuria urgency frequency along with cloudy urine and odor.  Patient returns to the clinic for urinalysis which showed likelihood of infection noted above - Urine Culture

## 2021-11-08 LAB — URINE CULTURE

## 2021-11-08 LAB — SPECIMEN STATUS REPORT

## 2021-11-11 ENCOUNTER — Ambulatory Visit: Payer: Self-pay | Admitting: Family Medicine

## 2021-11-27 ENCOUNTER — Other Ambulatory Visit: Payer: Self-pay | Admitting: Family Medicine

## 2021-11-27 DIAGNOSIS — K580 Irritable bowel syndrome with diarrhea: Secondary | ICD-10-CM

## 2021-11-27 NOTE — Telephone Encounter (Signed)
Requested Prescriptions  Pending Prescriptions Disp Refills   PREVALITE 4 GM/DOSE powder [Pharmacy Med Name: PREVALITE POWDER] 231 g 0    Sig: TAKE 1 TEASPOONFUL BY MOUTH EVERY DAY AS DIRECTED     Cardiovascular:  Antilipid - Bile Acid Sequestrants Failed - 11/27/2021 12:31 PM      Failed - Total Cholesterol in normal range and within 360 days    Cholesterol, Total  Date Value Ref Range Status  07/10/2021 218 (H) 100 - 199 mg/dL Final         Failed - LDL in normal range and within 360 days    LDL Chol Calc (NIH)  Date Value Ref Range Status  07/10/2021 130 (H) 0 - 99 mg/dL Final         Passed - HDL in normal range and within 360 days    HDL  Date Value Ref Range Status  07/10/2021 62 >39 mg/dL Final         Passed - Triglycerides in normal range and within 360 days    Triglycerides  Date Value Ref Range Status  07/10/2021 147 0 - 149 mg/dL Final         Passed - Valid encounter within last 12 months    Recent Outpatient Visits          3 weeks ago [facility] Juline Patch, MD   4 months ago [facility] Juline Patch, MD   4 months ago Type 2 diabetes mellitus without complication, without long-term current use of insulin ([place])   [facility] Juline Patch, MD   11 months ago Type 2 diabetes mellitus without complication, without long-term current use of insulin ([facility])   [facility] Juline Patch, MD   1 year ago Cystitis   [facility] Juline Patch, MD

## 2021-12-17 DIAGNOSIS — H6123 Impacted cerumen, bilateral: Secondary | ICD-10-CM | POA: Diagnosis not present

## 2021-12-17 DIAGNOSIS — H903 Sensorineural hearing loss, bilateral: Secondary | ICD-10-CM | POA: Diagnosis not present

## 2021-12-17 DIAGNOSIS — J301 Allergic rhinitis due to pollen: Secondary | ICD-10-CM | POA: Diagnosis not present

## 2022-01-06 ENCOUNTER — Telehealth: Payer: Self-pay | Admitting: Family Medicine

## 2022-01-06 NOTE — Telephone Encounter (Signed)
Left message for patient to call back and schedule Medicare Annual Wellness Visit (AWV) in office.   If unable to come into the office for AWV,  please offer to do virtually or by telephone.  No hx of AWV   Please schedule at anytime with [facility] Health Advisor.      40 Minutes appointment   Any questions, please call me at [phone]

## 2022-01-14 DIAGNOSIS — E119 Type 2 diabetes mellitus without complications: Secondary | ICD-10-CM | POA: Diagnosis not present

## 2022-01-14 LAB — HM DIABETES EYE EXAM

## 2022-01-16 ENCOUNTER — Other Ambulatory Visit: Payer: Self-pay | Admitting: Family Medicine

## 2022-01-16 DIAGNOSIS — E119 Type 2 diabetes mellitus without complications: Secondary | ICD-10-CM

## 2022-01-16 NOTE — Telephone Encounter (Signed)
Requested Prescriptions  Pending Prescriptions Disp Refills   glipiZIDE (GLUCOTROL XL) 2.5 MG 24 hr tablet [Pharmacy Med Name: GLIPIZIDE ER 2.5 MG TABLET] 90 tablet 0    Sig: TAKE 1 TABLET BY MOUTH EVERY DAY WITH BREAKFAST     Endocrinology:  Diabetes - Sulfonylureas Failed - 01/16/2022  2:14 AM      Failed - HBA1C is between 0 and 7.9 and within 180 days    Hgb A1c MFr Bld  Date Value Ref Range Status  07/10/2021 7.0 (H) 4.8 - 5.6 % Final    Comment:             Prediabetes: 5.7 - 6.4          Diabetes: >6.4          Glycemic control for adults with diabetes: <7.0          Passed - Cr in normal range and within 360 days    Creatinine, Ser  Date Value Ref Range Status  07/10/2021 0.71 0.57 - 1.00 mg/dL Final         Passed - Valid encounter within last 6 months    Recent Outpatient Visits          2 months ago [facility] Juline Patch, MD   5 months ago [facility] Juline Patch, MD   6 months ago Type 2 diabetes mellitus without complication, without long-term current use of insulin ([place])   [facility] Juline Patch, MD   1 year ago Type 2 diabetes mellitus without complication, without long-term current use of insulin ([facility])   [facility] Juline Patch, MD   1 year ago Cystitis   [facility] Juline Patch, MD

## 2022-01-17 ENCOUNTER — Ambulatory Visit (INDEPENDENT_AMBULATORY_CARE_PROVIDER_SITE_OTHER): Payer: Medicare Other | Admitting: Family Medicine

## 2022-01-17 ENCOUNTER — Encounter: Payer: Self-pay | Admitting: Family Medicine

## 2022-01-17 ENCOUNTER — Other Ambulatory Visit
Admission: RE | Admit: 2022-01-17 | Discharge: 2022-01-17 | Disposition: A | Discharge status: Home / Self Care | Payer: Medicare Other | Attending: Family Medicine | Admitting: Family Medicine

## 2022-01-17 ENCOUNTER — Other Ambulatory Visit: Payer: Self-pay

## 2022-01-17 VITALS — BP 150/80 | HR 80 | Ht 66.0 in | Wt 148.0 lb

## 2022-01-17 DIAGNOSIS — N3090 Cystitis, unspecified without hematuria: Secondary | ICD-10-CM

## 2022-01-17 DIAGNOSIS — K760 Fatty (change of) liver, not elsewhere classified: Secondary | ICD-10-CM | POA: Diagnosis not present

## 2022-01-17 DIAGNOSIS — R1011 Right upper quadrant pain: Secondary | ICD-10-CM

## 2022-01-17 DIAGNOSIS — R109 Unspecified abdominal pain: Secondary | ICD-10-CM | POA: Diagnosis not present

## 2022-01-17 DIAGNOSIS — N342 Other urethritis: Secondary | ICD-10-CM

## 2022-01-17 LAB — POCT URINALYSIS DIPSTICK
Bilirubin, UA: NEGATIVE
Blood, UA: NEGATIVE
Glucose, UA: NEGATIVE
Ketones, UA: NEGATIVE
Leukocytes, UA: NEGATIVE
Nitrite, UA: NEGATIVE
Protein, UA: NEGATIVE
Spec Grav, UA: 1.01 (ref 1.010–1.025)
Urobilinogen, UA: 0.2 E.U./dL
pH, UA: 5 (ref 5.0–8.0)

## 2022-01-17 LAB — HEPATIC FUNCTION PANEL
ALT: 14 U/L (ref 0–44)
AST: 18 U/L (ref 15–41)
Albumin: 4.3 g/dL (ref 3.5–5.0)
Alkaline Phosphatase: 87 U/L (ref 38–126)
Bilirubin, Direct: <0.1 mg/dL (ref 0.0–0.2)
Total Bilirubin: 0.7 mg/dL (ref 0.3–1.2)
Total Protein: 7.9 g/dL (ref 6.5–8.1)

## 2022-01-17 LAB — LIPASE, BLOOD: Lipase: 30 U/L (ref 11–51)

## 2022-01-17 MED ORDER — NITROFURANTOIN MONOHYD MACRO 100 MG PO CAPS: 100.0000 mg | ORAL_CAPSULE | Freq: Two times a day (BID) | ORAL | 0 refills | Status: AC

## 2022-01-17 NOTE — Progress Notes (Signed)
Date:  01/17/2022   Name:  Rebecca Robbins   DOB:  August 13, 1946   MRN:  [id number]   Chief Complaint: Urinary Frequency (X2 days; dysuria, nausea associated)  Urinary Frequency  This is a chronic problem. The current episode started more than 1 year ago. The problem has been gradually improving. The quality of the pain is described as burning. Associated symptoms include frequency. Pertinent negatives include no chills, discharge, flank pain, hematuria, hesitancy, nausea, sweats, urgency or vomiting. She has tried NSAIDs for the symptoms. The treatment provided moderate relief.  Abdominal Pain This is a recurrent problem. The current episode started more than 1 year ago. The onset quality is sudden. The problem occurs intermittently. The problem has been unchanged. The pain is located in the RUQ. The pain is at a severity of 8/10. The pain is moderate. The quality of the pain is colicky. The abdominal pain radiates to the back. Associated symptoms include frequency. Pertinent negatives include no constipation, diarrhea, dysuria, fever, headaches, hematuria, myalgias, nausea or vomiting. Relieved by: ice pack. She has tried acetaminophen for the symptoms. The treatment provided mild relief.   Lab Results  Component Value Date   NA 138 07/10/2021   K 4.4 07/10/2021   CO2 18 (L) 07/10/2021   GLUCOSE 150 (H) 07/10/2021   BUN 11 07/10/2021   CREATININE 0.71 07/10/2021   CALCIUM 10.0 07/10/2021   EGFR 88 07/10/2021   GFRNONAA 89 12/18/2020   Lab Results  Component Value Date   CHOL 218 (H) 07/10/2021   HDL 62 07/10/2021   LDLCALC 130 (H) 07/10/2021   TRIG 147 07/10/2021   CHOLHDL 3.1 08/23/2018   Lab Results  Component Value Date   TSH 2.770 07/10/2021   Lab Results  Component Value Date   HGBA1C 7.0 (H) 07/10/2021   No results found for: WBC, HGB, HCT, MCV, PLT Lab Results  Component Value Date   ALT 35 (H) 12/18/2020   AST 30 12/18/2020   ALKPHOS 90 12/18/2020   BILITOT  0.5 12/18/2020   No results found for: 25OHVITD2, 25OHVITD3, VD25OH   Review of Systems  Constitutional:  Negative for chills and fever.  HENT:  Negative for drooling, ear discharge, ear pain and sore throat.   Respiratory:  Negative for cough, shortness of breath and wheezing.   Cardiovascular:  Negative for chest pain, palpitations and leg swelling.  Gastrointestinal:  Positive for abdominal pain. Negative for blood in stool, constipation, diarrhea, nausea and vomiting.  Endocrine: Negative for polydipsia.  Genitourinary:  Positive for frequency. Negative for dysuria, flank pain, hematuria, hesitancy and urgency.  Musculoskeletal:  Negative for back pain, myalgias and neck pain.  Skin:  Negative for rash.  Allergic/Immunologic: Negative for environmental allergies.  Neurological:  Negative for dizziness and headaches.  Hematological:  Does not bruise/bleed easily.  Psychiatric/Behavioral:  Negative for suicidal ideas. The patient is not nervous/anxious.    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    Allergies  Allergen Reactions   Azithromycin Rash   Hydrocodone-Acetaminophen Nausea Only and Rash  Penicillins Other (See Comments)    Does not work for patient    Past Surgical History:  Procedure Laterality Date   BREAST BIOPSY Right 2004   neg   CHOLECYSTECTOMY     COLONOSCOPY  2015   repeat 10 yrs- Dr Allen Norris   MENISCUS REPAIR Bilateral    Right x2, Left x1    Social History   Tobacco Use    Smoking status: Never   Smokeless tobacco: Never  Vaping Use   Vaping Use: Never used  Substance Use Topics   Alcohol use: Yes    Alcohol/week: 3.0 standard drinks    Types: 3 Glasses of wine per week   Drug use: No     Medication list has been reviewed and updated.  Current Meds  Medication Sig   glipiZIDE (GLUCOTROL XL) 2.5 MG 24 hr tablet TAKE 1 TABLET BY MOUTH EVERY DAY WITH BREAKFAST   levothyroxine (SYNTHROID) 25 MCG tablet Take 1 tablet (25 mcg total) by mouth daily.   metFORMIN (GLUCOPHAGE-XR) 750 MG 24 hr tablet TAKE 1 TABLET BY MOUTH EVERY DAY WITH BREAKFAST   pantoprazole (PROTONIX) 40 MG tablet Take 1 tablet (40 mg total) by mouth daily.   PREVALITE 4 GM/DOSE powder TAKE 1 TEASPOONFUL BY MOUTH EVERY DAY AS DIRECTED   triamcinolone (NASACORT) 55 MCG/ACT AERO nasal inhaler Place 2 sprays into the nose daily.    PHQ 2/9 Scores 01/17/2022 11/05/2021 07/23/2021 07/10/2021  PHQ - 2 Score 0 0 0 0  PHQ- 9 Score 2 2 0 0    GAD 7 : Generalized Anxiety Score 01/17/2022 11/05/2021 07/23/2021 07/10/2021  Nervous, Anxious, on Edge 0 0 0 0  Control/stop worrying 0 0 0 0  Worry too much - different things 0 0 0 0  Trouble relaxing 0 0 0 0  Restless 0 0 0 0  Easily annoyed or irritable 0 0 0 0  Afraid - awful might happen 0 0 0 0  Total GAD 7 Score 0 0 0 0  Anxiety Difficulty Not difficult at all Not difficult at all - -    BP Readings from Last 3 Encounters:  01/17/22 (!) 150/80  11/05/21 124/64  07/10/21 130/74    Physical Exam Vitals and nursing note reviewed.  Constitutional:      Appearance: She is well-developed.  HENT:     Head: Normocephalic.     Right Ear: Tympanic membrane and external ear normal.     Left Ear: Tympanic membrane and external ear normal.     Nose: Nose normal.  Eyes:     General: Lids are everted, no foreign bodies appreciated. No scleral icterus.       Left eye: No foreign body or hordeolum.     Conjunctiva/sclera: Conjunctivae normal.      Right eye: Right conjunctiva is not injected.     Left eye: Left conjunctiva is not injected.     Pupils: Pupils are equal, round, and reactive to light.  Neck:     Thyroid: No thyromegaly.     Vascular: No JVD.     Trachea: No tracheal deviation.  Cardiovascular:     Rate and Rhythm: Normal rate and regular rhythm.     Heart sounds: Normal heart sounds, S1 normal and S2 normal. No murmur heard.   No friction rub. No gallop. No S3 or S4 sounds.  Pulmonary:     Effort: Pulmonary effort is normal. No respiratory distress.     Breath sounds: Normal breath sounds.  No wheezing, rhonchi or rales.  Abdominal:     General: Bowel sounds are normal.     Palpations: Abdomen is soft. There is no hepatomegaly, splenomegaly or mass.     Tenderness: There is abdominal tenderness in the right upper quadrant. There is no guarding or rebound.  Musculoskeletal:        General: No tenderness. Normal range of motion.     Cervical back: Normal range of motion and neck supple.  Lymphadenopathy:     Cervical: No cervical adenopathy.  Skin:    General: Skin is warm.     Findings: No rash.  Neurological:     Mental Status: She is alert and oriented to person, place, and time.     Cranial Nerves: No cranial nerve deficit.     Deep Tendon Reflexes: Reflexes normal.  Psychiatric:        Mood and Affect: Mood is not anxious or depressed.    Wt Readings from Last 3 Encounters:  01/17/22 148 lb (67.1 kg)  11/05/21 146 lb (66.2 kg)  07/10/21 146 lb (66.2 kg)    BP (!) 150/80 (BP Location: Right Arm, Patient Position: Sitting, Cuff Size: Normal)    Pulse 80    Ht 5' 6"  (1.676 m)    Wt 148 lb (67.1 kg)    BMI 23.89 kg/m   Assessment and Plan:  1. RUQ pain New onset of recurrence.  Relatively controlled.  Episodes occur once a week.  Not associated with other symptoms.  There is noted on exam to be tenderness in the right upper quadrant and epigastric area.  Patient has a history of cholecystectomy.  We  will obtain an ultrasound of the right upper quadrant given concern for biliary track etiology as well as obtain a lipase and hepatic function panel. - US Abdomen Limited RUQ (LIVER/GB); Future - Lipase - Hepatic Function Panel (6)  2. Steatosis of liver Review of previous ultrasound noted that there was increase fatty liver with no cyst or masses evident.  This is had several years in duration and we will repeat liver function and right upper quadrant ultrasound.  Patient with symptoms of cystitis obtain POCT urinalysis. - US Abdomen Limited RUQ (LIVER/GB); Future  3. Cystitis Description of dysuria urgency frequency would indicate the possibility of ureteritis - POCT urinalysis dipstick  4. Urethritis Patient's head and multiple episodes over the past 24 hours of frequency and dysuria.  I suspect there is a level of urethritis and we will treat with Macrodantin 100 mg 1 twice a day for 3 days. - nitrofurantoin, macrocrystal-monohydrate, (MACROBID) 100 MG capsule; Take 1 capsule (100 mg total) by mouth 2 (two) times daily for 7 days.  Dispense: 6 capsule; Refill: 0

## 2022-01-21 ENCOUNTER — Other Ambulatory Visit: Payer: Self-pay

## 2022-01-21 ENCOUNTER — Ambulatory Visit
Admission: RE | Admit: 2022-01-21 | Discharge: 2022-01-21 | Disposition: A | Discharge status: Home / Self Care | Payer: Medicare Other | Source: Ambulatory Visit | Attending: Family Medicine | Admitting: Family Medicine

## 2022-01-21 DIAGNOSIS — Z9049 Acquired absence of other specified parts of digestive tract: Secondary | ICD-10-CM | POA: Diagnosis not present

## 2022-01-21 DIAGNOSIS — R1011 Right upper quadrant pain: Secondary | ICD-10-CM | POA: Diagnosis not present

## 2022-01-21 DIAGNOSIS — K760 Fatty (change of) liver, not elsewhere classified: Secondary | ICD-10-CM | POA: Diagnosis not present

## 2022-01-22 ENCOUNTER — Other Ambulatory Visit: Payer: Self-pay

## 2022-01-22 DIAGNOSIS — E7801 Familial hypercholesterolemia: Secondary | ICD-10-CM

## 2022-01-22 MED ORDER — ROSUVASTATIN CALCIUM 5 MG PO TABS: 5.0000 mg | ORAL_TABLET | Freq: Every day | ORAL | 0 refills | Status: DC

## 2022-01-22 NOTE — Progress Notes (Signed)
Sent in crestor 5mg  qday

## 2022-03-18 ENCOUNTER — Encounter: Payer: Self-pay | Admitting: Family Medicine

## 2022-03-18 ENCOUNTER — Ambulatory Visit (INDEPENDENT_AMBULATORY_CARE_PROVIDER_SITE_OTHER): Payer: Medicare Other | Admitting: Family Medicine

## 2022-03-18 VITALS — BP 160/100 | HR 64 | Ht 66.0 in | Wt 145.0 lb

## 2022-03-18 DIAGNOSIS — E119 Type 2 diabetes mellitus without complications: Secondary | ICD-10-CM

## 2022-03-18 DIAGNOSIS — L570 Actinic keratosis: Secondary | ICD-10-CM

## 2022-03-18 DIAGNOSIS — E039 Hypothyroidism, unspecified: Secondary | ICD-10-CM | POA: Diagnosis not present

## 2022-03-18 DIAGNOSIS — K760 Fatty (change of) liver, not elsewhere classified: Secondary | ICD-10-CM | POA: Diagnosis not present

## 2022-03-18 DIAGNOSIS — E7801 Familial hypercholesterolemia: Secondary | ICD-10-CM | POA: Diagnosis not present

## 2022-03-18 DIAGNOSIS — K580 Irritable bowel syndrome with diarrhea: Secondary | ICD-10-CM | POA: Diagnosis not present

## 2022-03-18 DIAGNOSIS — R5383 Other fatigue: Secondary | ICD-10-CM

## 2022-03-18 DIAGNOSIS — K219 Gastro-esophageal reflux disease without esophagitis: Secondary | ICD-10-CM

## 2022-03-18 DIAGNOSIS — M791 Myalgia, unspecified site: Secondary | ICD-10-CM

## 2022-03-18 MED ORDER — METFORMIN HCL ER 750 MG PO TB24: ORAL_TABLET | ORAL | 1 refills | Status: DC

## 2022-03-18 MED ORDER — CHOLESTYRAMINE LIGHT 4 GM/DOSE PO POWD: ORAL | 0 refills | Status: DC

## 2022-03-18 MED ORDER — EZETIMIBE 10 MG PO TABS: 10.0000 mg | ORAL_TABLET | Freq: Every day | ORAL | 2 refills | Status: DC

## 2022-03-18 MED ORDER — PANTOPRAZOLE SODIUM 40 MG PO TBEC: 40.0000 mg | DELAYED_RELEASE_TABLET | Freq: Every day | ORAL | 1 refills | Status: DC

## 2022-03-18 MED ORDER — LEVOTHYROXINE SODIUM 25 MCG PO TABS: 25.0000 ug | ORAL_TABLET | Freq: Every day | ORAL | 1 refills | Status: DC

## 2022-03-18 MED ORDER — GLIPIZIDE ER 2.5 MG PO TB24: ORAL_TABLET | ORAL | 1 refills | Status: DC

## 2022-03-18 NOTE — Progress Notes (Signed)
? ? ?Date:  03/18/2022  ? ?Name:  Rebecca Robbins   DOB:  12-11-1946   MRN:  [id number] ? ? ?Chief Complaint: Gastroesophageal Reflux, Hypothyroidism, Diabetes, and Irritable Bowel Syndrome ? ?Gastroesophageal Reflux ?She complains of abdominal pain and heartburn. She reports no belching, no chest pain, no choking, no coughing, no dysphagia, no early satiety, no nausea, no sore throat or no wheezing. This is a chronic problem. The current episode started more than 1 year ago. The problem occurs occasionally. The problem has been waxing and waning. The symptoms are aggravated by certain foods. Pertinent negatives include no anemia, fatigue, melena, muscle weakness, orthopnea or weight loss. She has tried a PPI for the symptoms. The treatment provided mild relief.  ?Diabetes ?She presents for her follow-up diabetic visit. She has type 2 diabetes mellitus. Her disease course has been stable. There are no hypoglycemic associated symptoms. Pertinent negatives for hypoglycemia include no dizziness, headaches or nervousness/anxiousness. There are no diabetic associated symptoms. Pertinent negatives for diabetes include no chest pain, no fatigue, no polydipsia and no weight loss. There are no hypoglycemic complications. Symptoms are stable. There are no diabetic complications. There are no known risk factors for coronary artery disease. When asked about current treatments, none were reported. She is following a generally healthy diet. Meal planning includes avoidance of concentrated sweets and carbohydrate counting. An ACE inhibitor/angiotensin II receptor blocker is being taken.  ?Hyperlipidemia ?This is a chronic problem. The current episode started more than 1 year ago. The problem is controlled. Recent lipid tests were reviewed and are normal. She has no history of chronic renal disease, diabetes, hypothyroidism, liver disease, obesity or nephrotic syndrome. Pertinent negatives include no chest pain, myalgias or  shortness of breath. She is currently on no antihyperlipidemic treatment. The current treatment provides moderate improvement of lipids. There are no compliance problems.   ? ?Lab Results  ?Component Value Date  ? NA 138 07/10/2021  ? K 4.4 07/10/2021  ? CO2 18 (L) 07/10/2021  ? GLUCOSE 150 (H) 07/10/2021  ? BUN 11 07/10/2021  ? CREATININE 0.71 07/10/2021  ? CALCIUM 10.0 07/10/2021  ? EGFR 88 07/10/2021  ? GFRNONAA 89 12/18/2020  ? ?Lab Results  ?Component Value Date  ? CHOL 218 (H) 07/10/2021  ? HDL 62 07/10/2021  ? LDLCALC 130 (H) 07/10/2021  ? TRIG 147 07/10/2021  ? CHOLHDL 3.1 08/23/2018  ? ?Lab Results  ?Component Value Date  ? TSH 2.770 07/10/2021  ? ?Lab Results  ?Component Value Date  ? HGBA1C 7.0 (H) 07/10/2021  ? ?No results found for: WBC, HGB, HCT, MCV, PLT ?Lab Results  ?Component Value Date  ? ALT 14 01/17/2022  ? AST 18 01/17/2022  ? ALKPHOS 87 01/17/2022  ? BILITOT 0.7 01/17/2022  ? ?No results found for: 25OHVITD2, [place], VD25OH  ? ?Review of Systems  ?Constitutional:  Negative for chills, fatigue, fever and weight loss.  ?HENT:  Negative for drooling, ear discharge, ear pain and sore throat.   ?Respiratory:  Negative for cough, choking, shortness of breath and wheezing.   ?Cardiovascular:  Negative for chest pain, palpitations and leg swelling.  ?Gastrointestinal:  Positive for abdominal pain and heartburn. Negative for blood in stool, constipation, diarrhea, dysphagia, melena and nausea.  ?Endocrine: Negative for polydipsia.  ?Genitourinary:  Negative for dysuria, frequency, hematuria and urgency.  ?Musculoskeletal:  Negative for back pain, myalgias, muscle weakness and neck pain.  ?Skin:  Negative for rash.  ?Allergic/Immunologic: Negative for environmental allergies.  ?Neurological:  Negative  for dizziness and headaches.  ?Hematological:  Does not bruise/bleed easily.  ?Psychiatric/Behavioral:  Negative for suicidal ideas. The patient is not nervous/anxious.   ? ?Patient Active Problem List   ? Diagnosis Date Noted  ? Recurrent major depressive disorder, in partial remission ([place]) 01/06/2018  ? Abnormal mammogram 03/02/2017  ? Pure hyperglyceridemia 03/02/2017  ? Irritable bowel syndrome with diarrhea 03/02/2017  ? Type 2 diabetes mellitus without complication, without long-term current use of insulin ([place]) 08/28/2016  ? Esophageal reflux 08/28/2016  ? Adult hypothyroidism 08/28/2016  ? Allergic rhinitis due to pollen 08/28/2016  ? Cephalalgia 11/23/2014  ? 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 ? ?Allergies  ?Allergen Reactions  ? Azithromycin Rash  ? Hydrocodone-Acetaminophen Nausea Only and Rash  ? Penicillins Other (See Comments)  ?  Does not work for patient  ? ? ?Past Surgical History:  ?Procedure Laterality Date  ? BREAST BIOPSY Right 2004  ? neg  ? CHOLECYSTECTOMY    ? COLONOSCOPY  2015  ? repeat 10 yrs- Dr Allen Norris  ? MENISCUS REPAIR Bilateral   ? Right x2, Left x1  ? ? ?Social History  ? ?Tobacco Use  ? Smoking status: Never  ? Smokeless tobacco: Never  ?Vaping Use  ? Vaping Use: Never used  ?Substance Use Topics  ? Alcohol use: Yes  ?  Alcohol/week: 3.0 standard drinks  ?  Types: 3 Glasses of wine per week  ? Drug use: No  ? ? ? ?Medication list has been reviewed and updated. ? ?Current Meds  ?Medication Sig  ? triamcinolone (NASACORT) 55 MCG/ACT AERO nasal inhaler Place 2 sprays into the nose daily.  ? [DISCONTINUED] glipiZIDE (GLUCOTROL XL) 2.5 MG 24 hr tablet TAKE 1 TABLET BY MOUTH EVERY DAY WITH BREAKFAST  ? [DISCONTINUED] levothyroxine (SYNTHROID) 25 MCG tablet Take 1 tablet (25 mcg total) by mouth daily.  ? [DISCONTINUED] metFORMIN (GLUCOPHAGE-XR) 750 MG 24 hr tablet TAKE 1 TABLET BY MOUTH EVERY DAY WITH BREAKFAST  ? [DISCONTINUED] pantoprazole (PROTONIX) 40 MG tablet Take 1 tablet (40 mg total) by mouth daily.  ? [DISCONTINUED] PREVALITE 4 GM/DOSE powder TAKE 1 TEASPOONFUL BY MOUTH EVERY DAY AS DIRECTED  ? ? ? ?  03/18/2022  ?  9:34 AM 01/17/2022  ?  9:40 AM 11/05/2021  ? 11:52 AM 07/23/2021  ?   9:07 AM  ?GAD 7 : Generalized Anxiety Score  ?Nervous, Anxious, on Edge 0 0 0 0  ?Control/stop worrying 2 0 0 0  ?Worry too much - different things 0 0 0 0  ?Trouble relaxing 2 0 0 0  ?Restless 0 0 0 0  ?Easily annoyed or irritable 0 0 0 0  ?Afraid - awful might happen 0 0 0 0  ?Total GAD 7 Score 4 0 0 0  ?Anxiety Difficulty Not difficult at all Not difficult at all Not difficult at all   ? ? ? ?  03/18/2022  ?  9:33 AM  ?Depression screen PHQ 2/9  ?Decreased Interest 0  ?Down, Depressed, Hopeless 1  ?PHQ - 2 Score 1  ?Altered sleeping 0  ?Tired, decreased energy 3  ?Change in appetite 2  ?Feeling bad or failure about yourself  0  ?Trouble concentrating 0  ?Moving slowly or fidgety/restless 0  ?Suicidal thoughts 0  ?PHQ-9 Score 6  ?Difficult doing work/chores Not difficult at all  ? ? ?BP Readings from Last 3 Encounters:  ?03/18/22 (!) 160/100  ?01/17/22 (!) 150/80  ?11/05/21 124/64  ? ? ?Physical Exam ?Vitals and nursing note  reviewed.  ?Constitutional:   ?   Appearance: She is well-developed.  ?HENT:  ?   Head: Normocephalic.  ?   Right Ear: Tympanic membrane, ear canal and external ear normal. There is no impacted cerumen.  ?   Left Ear: Tympanic membrane, ear canal and external ear normal. There is no impacted cerumen.  ?   Nose: Nose normal. No congestion.  ?Eyes:  ?   General: Lids are everted, no foreign bodies appreciated. No scleral icterus.    ?   Left eye: No foreign body or hordeolum.  ?   Conjunctiva/sclera: Conjunctivae normal.  ?   Right eye: Right conjunctiva is not injected.  ?   Left eye: Left conjunctiva is not injected.  ?   Pupils: Pupils are equal, round, and reactive to light.  ?Neck:  ?   Thyroid: No thyromegaly.  ?   Vascular: No JVD.  ?   Trachea: No tracheal deviation.  ?Cardiovascular:  ?   Rate and Rhythm: Normal rate and regular rhythm.  ?   Heart sounds: Normal heart sounds. No murmur heard. ?  No friction rub. No gallop.  ?Pulmonary:  ?   Effort: Pulmonary effort is normal. No  respiratory distress.  ?   Breath sounds: Normal breath sounds. No wheezing or rales.  ?Abdominal:  ?   General: Bowel sounds are normal.  ?   Palpations: Abdomen is soft. There is no mass.  ?   Tenderness: There is no a

## 2022-03-21 LAB — HEMOGLOBIN A1C
Est. average glucose Bld gHb Est-mCnc: 148 mg/dL
Hgb A1c MFr Bld: 6.8 % — ABNORMAL HIGH (ref 4.8–5.6)

## 2022-03-21 LAB — COMPREHENSIVE METABOLIC PANEL
ALT: 11 IU/L (ref 0–32)
AST: 19 IU/L (ref 0–40)
Albumin/Globulin Ratio: 1.6 (ref 1.2–2.2)
Albumin: 4.7 g/dL (ref 3.7–4.7)
Alkaline Phosphatase: 125 IU/L — ABNORMAL HIGH (ref 44–121)
BUN/Creatinine Ratio: 12 (ref 12–28)
BUN: 8 mg/dL (ref 8–27)
Bilirubin Total: 0.5 mg/dL (ref 0.0–1.2)
CO2: 19 mmol/L — ABNORMAL LOW (ref 20–29)
Calcium: 10.2 mg/dL (ref 8.7–10.3)
Chloride: 102 mmol/L (ref 96–106)
Creatinine, Ser: 0.66 mg/dL (ref 0.57–1.00)
Globulin, Total: 2.9 g/dL (ref 1.5–4.5)
Glucose: 140 mg/dL — ABNORMAL HIGH (ref 70–99)
Potassium: 4.5 mmol/L (ref 3.5–5.2)
Sodium: 141 mmol/L (ref 134–144)
Total Protein: 7.6 g/dL (ref 6.0–8.5)
eGFR: 91 mL/min/{1.73_m2} (ref >59)

## 2022-03-21 LAB — LIPID PANEL WITH LDL/HDL RATIO
Cholesterol, Total: 193 mg/dL (ref 100–199)
HDL: 60 mg/dL (ref >39)
LDL Chol Calc (NIH): 109 mg/dL — ABNORMAL HIGH (ref 0–99)
LDL/HDL Ratio: 1.8 ratio (ref 0.0–3.2)
Triglycerides: 138 mg/dL (ref 0–149)
VLDL Cholesterol Cal: 24 mg/dL (ref 5–40)

## 2022-03-21 LAB — MICROALBUMIN, URINE: Microalbumin, Urine: 3.4 ug/mL

## 2022-04-08 IMAGING — MR MR SHOULDER*R* W/O CM
4 of 5 series · 32 of 40 positions shown · non-contrast
Comparison: None.

CLINICAL DATA: Right shoulder pain for 6 months. Limited range of
motion

EXAM:
MRI OF THE RIGHT SHOULDER WITHOUT CONTRAST
TECHNIQUE: Multiplanar, multisequence MR imaging of the shoulder was performed.
No intravenous contrast was administered.

[Series 7: PD fat-sat · oblique · right · 4.0mm · 0.44mm/px · 9 of 24 slices shown (1 of 2)]
[im 1/24]
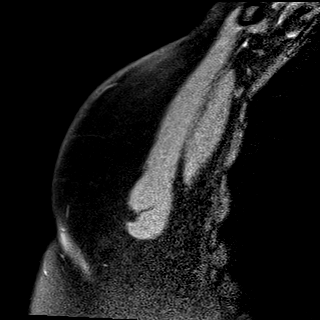
[im 3/24]
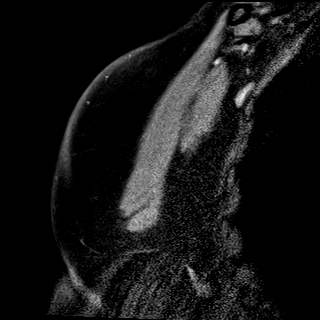
[im 6/24]
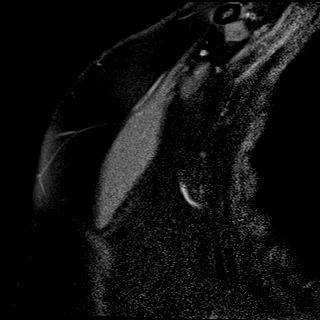
[im 9/24]
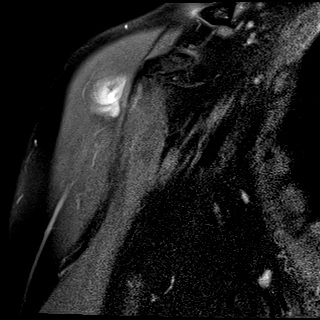
[im 12/24]
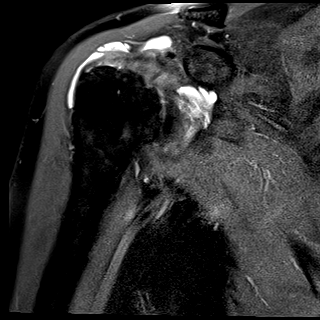
[im 15/24]
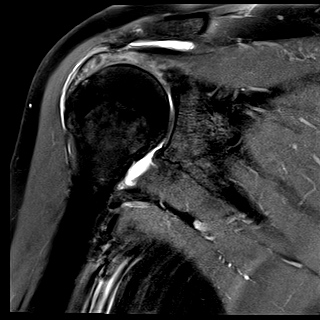
[im 18/24]
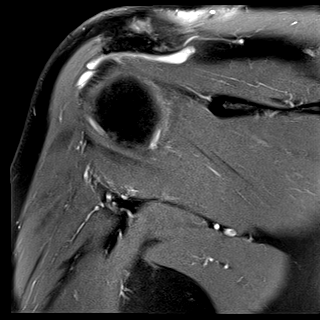
[im 21/24]
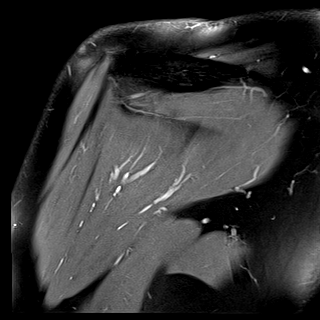
[im 24/24]
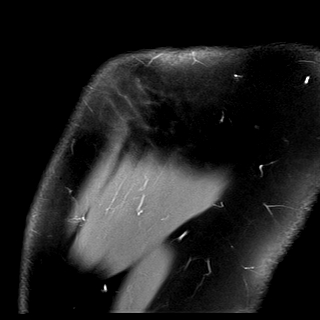

[Series 8: T2 fat-sat · oblique · right · 4.0mm · 0.44mm/px · 8 of 24 slices shown (1 of 2)]
[im 1/24]
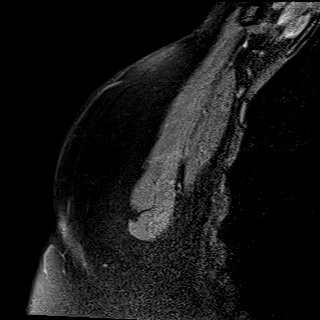
[im 4/24]
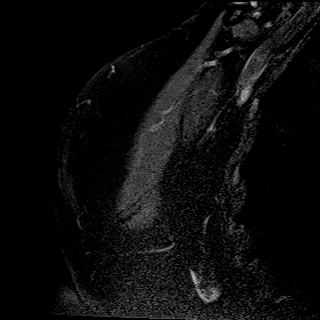
[im 7/24]
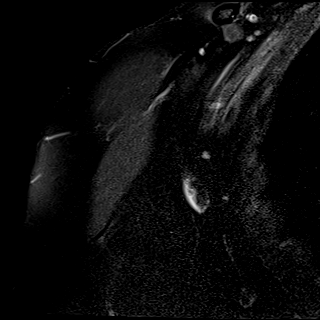
[im 10/24]
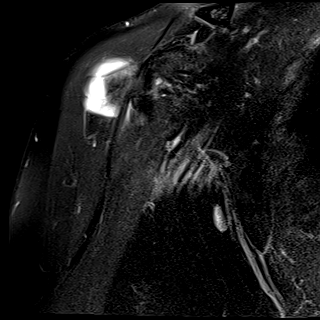
[im 14/24]
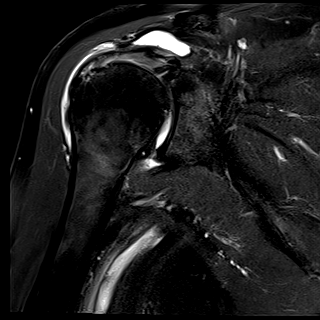
[im 17/24]
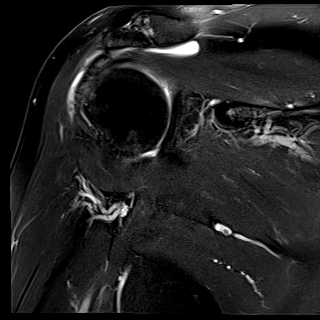
[im 20/24]
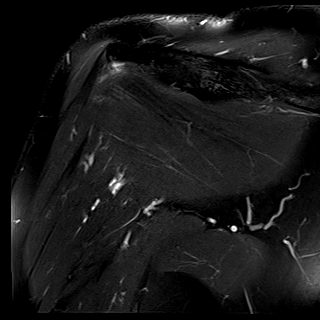
[im 24/24]
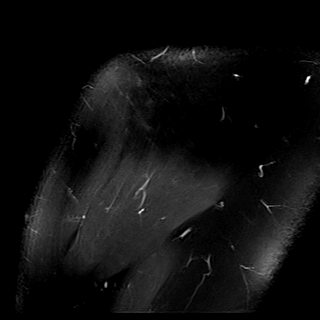

[Series 9: T2 fat-sat · oblique · right · 4.0mm · 0.23mm/px · 6 of 22 slices shown (2 of 2)]
[im 1/22]
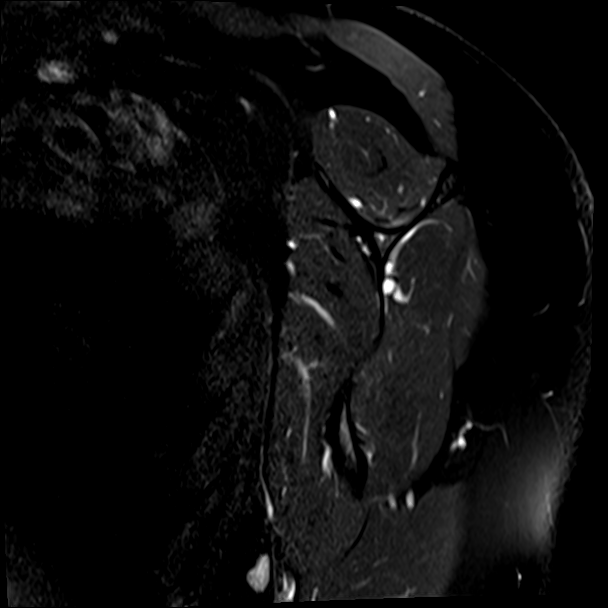
[im 4/22]
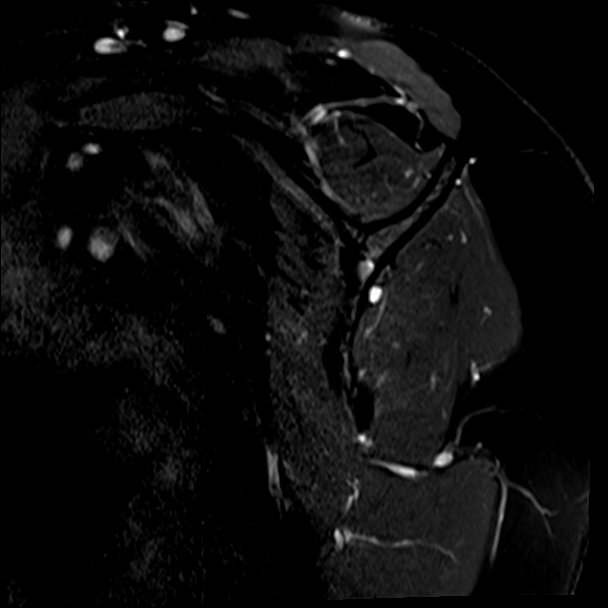
[im 8/22]
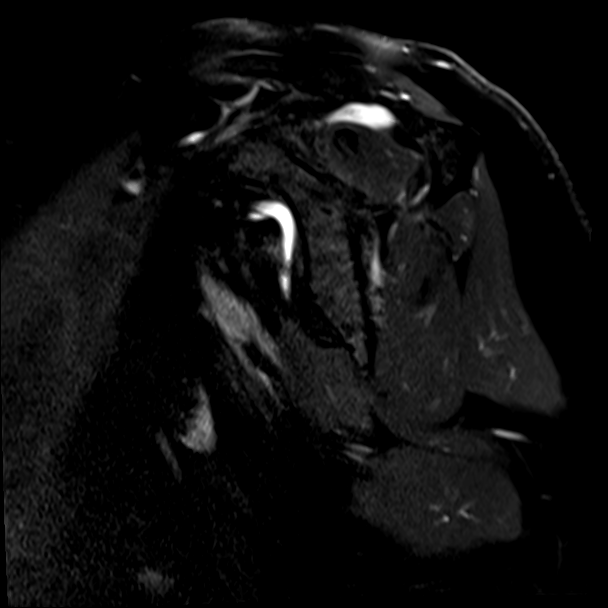
[im 11/22]
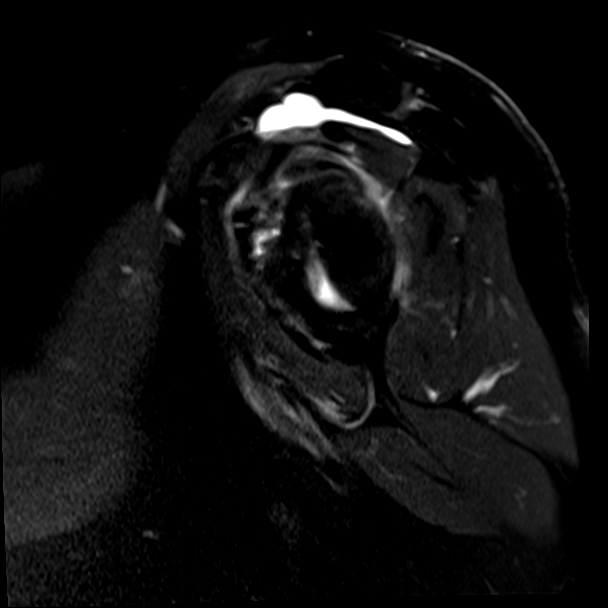
[im 15/22]
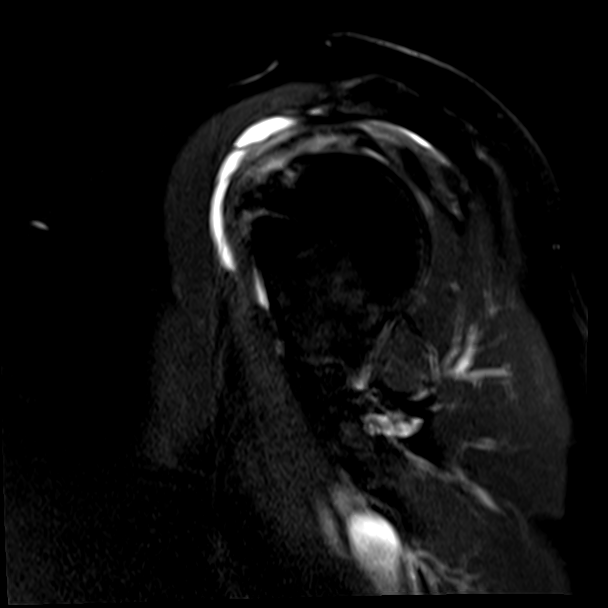
[im 18/22]
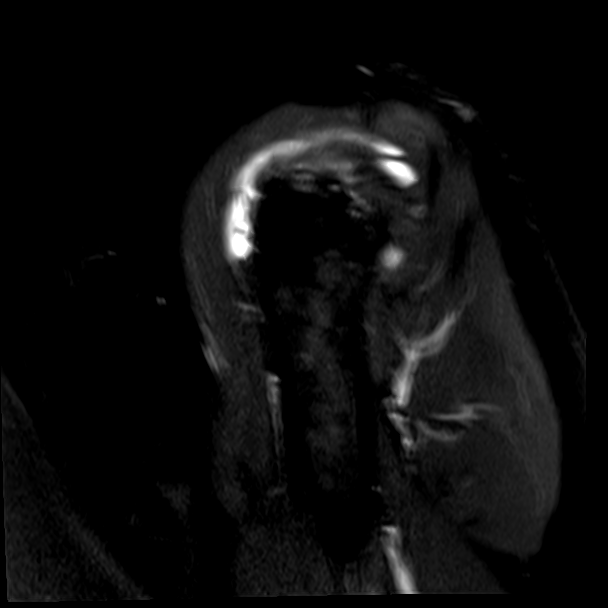

[Series 11: PD fat-sat · axial · right · 4.0mm · 0.55mm/px · z∈[-53,+76]mm · 9 of 28 slices shown (2 of 2)]
[im 1/28]
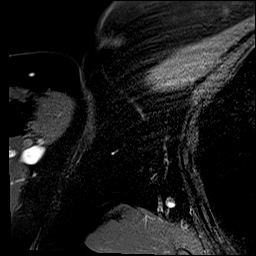
[im 4/28]
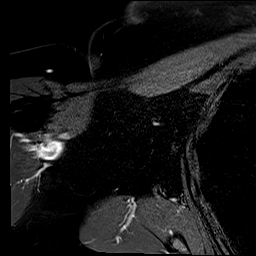
[im 7/28]
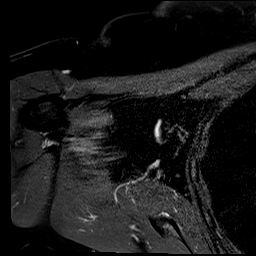
[im 11/28]
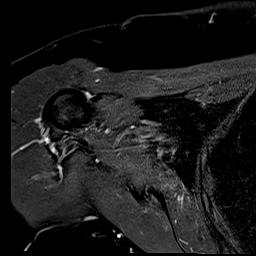
[im 14/28]
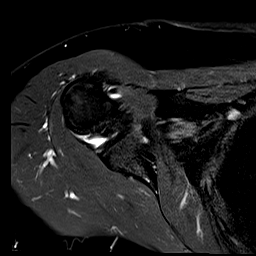
[im 17/28]
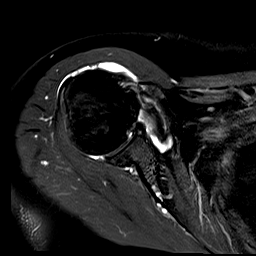
[im 21/28]
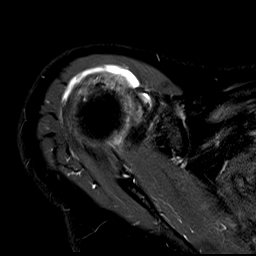
[im 24/28]
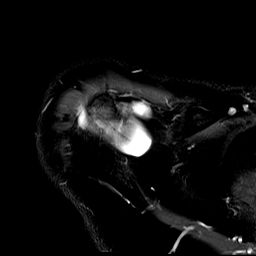
[im 28/28]
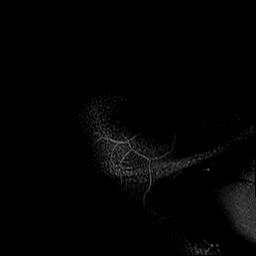

[32 of 40 positions shown; findings below may reference images not displayed]

FINDINGS: Rotator cuff: Moderate to severe tendinosis of the supraspinatus and
infraspinatus tendons. There is a focal articular sided tear of the
anterior aspect of the distal supraspinatus tendon with possible
full-thickness perforation (series 8, image 14). Subscapularis
tendinosis with low-grade tearing of the deep insertional tendon
fibers. Intact teres minor. No retracted rotator cuff tear.

Muscles: Preserved bulk and signal intensity of the rotator cuff
musculature without edema, atrophy, or fatty infiltration.

Biceps long head:  Intra-articular biceps tendinosis.

Acromioclavicular Joint: Mild arthropathy of the acromioclavicular
joint. Moderate volume subacromial-subdeltoid bursal fluid.

Glenohumeral Joint: Small glenohumeral joint effusion. Mild chondral
thinning and surface irregularity without focal full-thickness
defect.

Labrum: Labrum appears degenerated without a well-defined tear. No
paralabral cyst.

Bones:  No marrow abnormality, fracture or dislocation.

Other: Mild soft tissue edema within the rotator interval. Inferior
glenohumeral ligament does not appear thickened.
IMPRESSION: 1. Moderate-to-severe rotator cuff tendinosis. Focal articular-sided
tear of the anterior aspect of the distal supraspinatus tendon with
possible full-thickness perforation. Subscapularis tendinosis with
low-grade tearing of the deep insertional tendon fibers.
2. Intra-articular biceps tendinosis.
3. Moderate subacromial-subdeltoid bursitis.
4. Mild soft tissue edema within the rotator interval, which can be
seen in the setting of adhesive capsulitis.

## 2022-04-17 ENCOUNTER — Other Ambulatory Visit: Payer: Self-pay | Admitting: Family Medicine

## 2022-04-17 DIAGNOSIS — K580 Irritable bowel syndrome with diarrhea: Secondary | ICD-10-CM

## 2022-04-17 NOTE — Telephone Encounter (Signed)
Requested Prescriptions  ?Pending Prescriptions Disp Refills  ?? cholestyramine light (PREVALITE) 4 GM/DOSE powder [Pharmacy Med Name: PREVALITE POWDER] 239.4 g 0  ?  Sig: TAKE 1 TEASPOONFUL BY MOUTH EVERY DAY AS DIRECTED  ?  ? Cardiovascular:  Antilipid - Bile Acid Sequestrants Failed - 04/17/2022  9:33 AM  ?  ?  Failed - Lipid Panel in normal range within the last 12 months  ?  Cholesterol, Total  ?Date Value Ref Range Status  ?03/18/2022 193 100 - 199 mg/dL Final  ? ?LDL Chol Calc (NIH)  ?Date Value Ref Range Status  ?03/18/2022 109 (H) 0 - 99 mg/dL Final  ? ?HDL  ?Date Value Ref Range Status  ?03/18/2022 60 >39 mg/dL Final  ? ?Triglycerides  ?Date Value Ref Range Status  ?03/18/2022 138 0 - 149 mg/dL Final  ? ?  ?  ?  Passed - Valid encounter within last 12 months  ?  Recent Outpatient Visits   ?      ? 1 month ago Type 2 diabetes mellitus without complication, without long-term current use of insulin ([place])  ? [facility] Juline Patch, MD  ? 3 months ago RUQ pain  ? [facility] Juline Patch, MD  ? 5 months ago Cystitis  ? [facility] Juline Patch, MD  ? 8 months ago COVID  ? [facility] Juline Patch, MD  ? 9 months ago Type 2 diabetes mellitus without complication, without long-term current use of insulin ([place])  ? [facility] Juline Patch, MD  ?  ?  ?Future Appointments   ?        ? In 3 months Juline Patch, MD [facility], [place]  ?  ? ?  ?  ?  ? ?

## 2022-05-08 DIAGNOSIS — L570 Actinic keratosis: Secondary | ICD-10-CM | POA: Diagnosis not present

## 2022-05-08 DIAGNOSIS — H61002 Unspecified perichondritis of left external ear: Secondary | ICD-10-CM | POA: Diagnosis not present

## 2022-05-21 ENCOUNTER — Ambulatory Visit (INDEPENDENT_AMBULATORY_CARE_PROVIDER_SITE_OTHER): Payer: Medicare Other | Admitting: Gastroenterology

## 2022-05-21 ENCOUNTER — Other Ambulatory Visit: Payer: Self-pay | Admitting: Family Medicine

## 2022-05-21 ENCOUNTER — Encounter: Payer: Self-pay | Admitting: Gastroenterology

## 2022-05-21 VITALS — BP 159/79 | HR 84 | Temp 97.9°F | Ht 66.0 in | Wt 145.0 lb

## 2022-05-21 DIAGNOSIS — K580 Irritable bowel syndrome with diarrhea: Secondary | ICD-10-CM

## 2022-05-21 DIAGNOSIS — R748 Abnormal levels of other serum enzymes: Secondary | ICD-10-CM

## 2022-05-21 MED ORDER — HYOSCYAMINE SULFATE 0.125 MG SL SUBL: 0.1250 mg | SUBLINGUAL_TABLET | SUBLINGUAL | 1 refills | Status: AC | PRN

## 2022-05-21 NOTE — Progress Notes (Signed)
Gastroenterology Consultation  Referring Provider:     Juline Patch, MD Primary Care Physician:  Juline Patch, MD Primary Gastroenterologist:  Dr. Allen Norris     Reason for Consultation:     Irritable bowel syndrome with diarrhea        HPI:   Rebecca Robbins is a 76 y.o. y/o female referred for consultation & management of irritable bowel syndrome with diarrhea by Dr. Juline Patch, MD. This patient comes to see me today after being seen in the past in 2015 by the [facility] for a colonoscopy at that time.  The patient had an ultrasound back in February that showed fatty liver.  The patient had seen her primary care provider back in April and at that time was reporting GERD symptoms that were waxing and waning.  The patient has been on pantoprazole 40 mg a day. The patient denies any unexplained weight loss nausea vomiting fevers or chills.  She reports that usually has a few bowel movements in the morning and is that of the rest of the day but it is always diarrhea.  She has been taking colestipol since having her gallbladder out reports that it helps her symptoms but does not make it completely go away. The patient reports that she is having attacks of abdominal pain that goes from the epigastric area around her right side into her back.  She was also found to have an isolated increased alkaline phosphatase.  Abdominal pain can happen once a month but she reports that it last happened approximately 4 months ago.  Past Medical History:  Diagnosis Date   Diabetes mellitus without complication (HCC)    GERD (gastroesophageal reflux disease)    Hypothyroidism    Irritable bowel syndrome    PONV (postoperative nausea and vomiting)    Thyroid disease     Past Surgical History:  Procedure Laterality Date   BREAST BIOPSY Right 2004   neg   CHOLECYSTECTOMY     COLONOSCOPY  2015   repeat 10 yrs- Dr Allen Norris   MENISCUS REPAIR Bilateral    Right x2, Left x1    Prior to Admission  medications   Medication Sig Start Date End Date Taking? Authorizing Provider  cholestyramine light (PREVALITE) 4 GM/DOSE powder TAKE 1 TEASPOONFUL BY MOUTH EVERY DAY AS DIRECTED 04/17/22   Juline Patch, MD  ezetimibe (ZETIA) 10 MG tablet Take 1 tablet (10 mg total) by mouth daily. 03/18/22   Juline Patch, MD  glipiZIDE (GLUCOTROL XL) 2.5 MG 24 hr tablet TAKE 1 TABLET BY MOUTH EVERY DAY WITH BREAKFAST 03/18/22   Juline Patch, MD  levothyroxine (SYNTHROID) 25 MCG tablet Take 1 tablet (25 mcg total) by mouth daily. 03/18/22   Juline Patch, MD  metFORMIN (GLUCOPHAGE-XR) 750 MG 24 hr tablet TAKE 1 TABLET BY MOUTH EVERY DAY WITH BREAKFAST 03/18/22   Juline Patch, MD  pantoprazole (PROTONIX) 40 MG tablet Take 1 tablet (40 mg total) by mouth daily. 03/18/22   Juline Patch, MD  rosuvastatin (CRESTOR) 5 MG tablet Take 1 tablet (5 mg total) by mouth daily. Patient not taking: Reported on 03/18/2022 01/22/22   Juline Patch, MD  triamcinolone (NASACORT) 55 MCG/ACT AERO nasal inhaler Place 2 sprays into the nose daily. 08/23/18   Juline Patch, MD    Family History  Problem Relation Age of Onset   Cancer Mother    Diabetes Mother    Cancer Father  Heart disease Maternal Grandmother    Heart disease Paternal Grandmother    Breast cancer Neg Hx      Social History   Tobacco Use   Smoking status: Never   Smokeless tobacco: Never  Vaping Use   Vaping Use: Never used  Substance Use Topics   Alcohol use: Yes    Alcohol/week: 3.0 standard drinks    Types: 3 Glasses of wine per week   Drug use: No    Allergies as of 05/21/2022 - Review Complete 05/21/2022  Allergen Reaction Noted   Azithromycin Rash 03/02/2017   Hydrocodone-acetaminophen Nausea Only and Rash 01/15/2016   Penicillins Other (See Comments) 01/15/2016    Review of Systems:    All systems reviewed and negative except where noted in HPI.   Physical Exam:  There were no vitals taken for this visit. No LMP recorded.  Patient is postmenopausal. General:   Alert,  Well-developed, well-nourished, pleasant and cooperative in NAD Head:  Normocephalic and atraumatic. Eyes:  Sclera clear, no icterus.   Conjunctiva pink. Ears:  Normal auditory acuity. Neck:  Supple; no masses or thyromegaly. Lungs:  Respirations even and unlabored.  Clear throughout to auscultation.   No wheezes, crackles, or rhonchi. No acute distress. Heart:  Regular rate and rhythm; no murmurs, clicks, rubs, or gallops. Abdomen:  Normal bowel sounds.  No bruits.  Soft, non-tender and non-distended without masses, hepatosplenomegaly or hernias noted.  No guarding or rebound tenderness.  Negative Carnett sign.   Rectal:  Deferred.  Pulses:  Normal pulses noted. Extremities:  No clubbing or edema.  No cyanosis. Neurologic:  Alert and oriented x3;  grossly normal neurologically. Skin:  Intact without significant lesions or rashes.  No jaundice. Lymph Nodes:  No significant cervical adenopathy. Psych:  Alert and cooperative. Normal mood and affect.  Imaging Studies: No results found.  Assessment and Plan:   Rebecca Robbins is a 76 y.o. y/o female Who has a history of bleeding with diarrhea and irritable bowel syndrome with diarrhea predominance. The patient has been told to take Imodium in the morning to alleviate some of the diarrhea symptoms.  The patient will also have her blood sends off for fractionation of the alkaline phosphatase and a GGT.  The patient has also been told to take sublingual Levsin when she has the attacks to see if that helps her symptoms thereby confirming that it is intestinal spasms.  The patient has been explained the plan and agrees with it.    Lucilla Lame, MD. Marval Regal    Note: This dictation was prepared with Dragon dictation along with smaller phrase technology. Any transcriptional errors that result from this process are unintentional.

## 2022-05-21 NOTE — Telephone Encounter (Signed)
Requested Prescriptions  Pending Prescriptions Disp Refills  . cholestyramine light (PREVALITE) 4 GM/DOSE powder [Pharmacy Med Name: PREVALITE POWDER] 231 g 0    Sig: TAKE 1 TEASPOONFUL BY MOUTH EVERY DAY AS DIRECTED     Cardiovascular:  Antilipid - Bile Acid Sequestrants Failed - 05/21/2022 10:30 AM      Failed - Lipid Panel in normal range within the last 12 months    Cholesterol, Total  Date Value Ref Range Status  03/18/2022 193 100 - 199 mg/dL Final   LDL Chol Calc (NIH)  Date Value Ref Range Status  03/18/2022 109 (H) 0 - 99 mg/dL Final   HDL  Date Value Ref Range Status  03/18/2022 60 >39 mg/dL Final   Triglycerides  Date Value Ref Range Status  03/18/2022 138 0 - 149 mg/dL Final         Passed - Valid encounter within last 12 months    Recent Outpatient Visits          2 months ago Type 2 diabetes mellitus without complication, without long-term current use of insulin ([place])   [facility] Juline Patch, MD   4 months ago RUQ pain   [facility] Juline Patch, MD   6 months ago [place], Deanna C, MD   10 months ago [facility] Juline Patch, MD   10 months ago Type 2 diabetes mellitus without complication, without long-term current use of insulin [facility])   [facility] Juline Patch, MD      Future Appointments            In 2 months Juline Patch, MD [facility]

## 2022-05-26 LAB — ALKALINE PHOSPHATASE, ISOENZYMES
Alkaline Phosphatase: 100 IU/L (ref 44–121)
BONE FRACTION: 25 % (ref 14–68)
INTESTINAL FRAC.: 10 % (ref 0–18)
LIVER FRACTION: 65 % (ref 18–85)

## 2022-05-26 LAB — GAMMA GT: GGT: 29 IU/L (ref 0–60)

## 2022-06-03 ENCOUNTER — Encounter: Payer: Self-pay | Admitting: Gastroenterology

## 2022-06-04 ENCOUNTER — Other Ambulatory Visit: Payer: Self-pay | Admitting: Family Medicine

## 2022-06-04 DIAGNOSIS — K580 Irritable bowel syndrome with diarrhea: Secondary | ICD-10-CM

## 2022-06-20 ENCOUNTER — Other Ambulatory Visit: Payer: Self-pay | Admitting: Family Medicine

## 2022-06-20 DIAGNOSIS — K760 Fatty (change of) liver, not elsewhere classified: Secondary | ICD-10-CM

## 2022-06-20 DIAGNOSIS — E7801 Familial hypercholesterolemia: Secondary | ICD-10-CM

## 2022-06-26 DIAGNOSIS — Z86018 Personal history of other benign neoplasm: Secondary | ICD-10-CM | POA: Diagnosis not present

## 2022-06-26 DIAGNOSIS — D485 Neoplasm of uncertain behavior of skin: Secondary | ICD-10-CM | POA: Diagnosis not present

## 2022-06-26 DIAGNOSIS — L821 Other seborrheic keratosis: Secondary | ICD-10-CM | POA: Diagnosis not present

## 2022-06-26 DIAGNOSIS — Z09 Encounter for follow-up examination after completed treatment for conditions other than malignant neoplasm: Secondary | ICD-10-CM | POA: Diagnosis not present

## 2022-06-26 DIAGNOSIS — L578 Other skin changes due to chronic exposure to nonionizing radiation: Secondary | ICD-10-CM | POA: Diagnosis not present

## 2022-06-26 DIAGNOSIS — H61002 Unspecified perichondritis of left external ear: Secondary | ICD-10-CM | POA: Diagnosis not present

## 2022-06-26 DIAGNOSIS — L570 Actinic keratosis: Secondary | ICD-10-CM | POA: Diagnosis not present

## 2022-06-26 DIAGNOSIS — D225 Melanocytic nevi of trunk: Secondary | ICD-10-CM | POA: Diagnosis not present

## 2022-07-07 ENCOUNTER — Telehealth: Payer: Self-pay | Admitting: Family Medicine

## 2022-07-07 NOTE — Telephone Encounter (Signed)
Copied from [place] [id number]. Topic: Medicare AWV >> Jul 07, 2022  9:59 AM Jae Dire wrote: Reason for CRM:  Left message for patient to call back and schedule Medicare Annual Wellness Visit (AWV) in office.   If unable to come into the office for AWV,  please offer to do virtually or by telephone.  No hx of AWV   Please schedule at anytime with [facility] Health Advisor.      34 Minutes appointment   Any questions, please call me at [phone]

## 2022-07-24 ENCOUNTER — Encounter: Payer: Self-pay | Admitting: Family Medicine

## 2022-07-24 ENCOUNTER — Ambulatory Visit (INDEPENDENT_AMBULATORY_CARE_PROVIDER_SITE_OTHER): Payer: Medicare Other | Admitting: Family Medicine

## 2022-07-24 VITALS — BP 160/84 | HR 87 | Ht 66.0 in | Wt 149.0 lb

## 2022-07-24 DIAGNOSIS — E119 Type 2 diabetes mellitus without complications: Secondary | ICD-10-CM

## 2022-07-24 DIAGNOSIS — S60512A Abrasion of left hand, initial encounter: Secondary | ICD-10-CM

## 2022-07-24 DIAGNOSIS — T148XXA Other injury of unspecified body region, initial encounter: Secondary | ICD-10-CM

## 2022-07-24 DIAGNOSIS — E039 Hypothyroidism, unspecified: Secondary | ICD-10-CM

## 2022-07-24 DIAGNOSIS — I10 Essential (primary) hypertension: Secondary | ICD-10-CM | POA: Diagnosis not present

## 2022-07-24 MED ORDER — GLIPIZIDE ER 2.5 MG PO TB24
ORAL_TABLET | ORAL | 1 refills | Status: DC
Start: 2022-07-24 — End: 2022-07-25

## 2022-07-24 MED ORDER — METFORMIN HCL ER 750 MG PO TB24: ORAL_TABLET | ORAL | 1 refills | Status: DC

## 2022-07-24 MED ORDER — MUPIROCIN 2 % EX OINT: 1.0000 | TOPICAL_OINTMENT | Freq: Two times a day (BID) | CUTANEOUS | 0 refills | Status: DC

## 2022-07-24 MED ORDER — LOSARTAN POTASSIUM-HCTZ 50-12.5 MG PO TABS: 1.0000 | ORAL_TABLET | Freq: Every day | ORAL | 1 refills | Status: DC

## 2022-07-24 NOTE — Progress Notes (Signed)
Date:  07/24/2022   Name:  Rebecca Robbins   DOB:  1946/02/21   MRN:  [id number]   Chief Complaint: Hypothyroidism and Diabetes  Diabetes She presents for her follow-up diabetic visit. She has type 2 diabetes mellitus. Her disease course has been stable. Hypoglycemia symptoms include nervousness/anxiousness. Pertinent negatives for hypoglycemia include no headaches or tremors. There are no diabetic associated symptoms. Pertinent negatives for diabetes include no chest pain, no fatigue, no visual change and no weight loss. There are no hypoglycemic complications. Symptoms are stable. There are no diabetic complications. Risk factors for coronary artery disease include hypertension and dyslipidemia. Current diabetic treatment includes oral agent (dual therapy). She is compliant with treatment some of the time. Her weight is stable. She is following a generally unhealthy diet. Meal planning includes avoidance of concentrated sweets and carbohydrate counting. She participates in exercise intermittently. Her breakfast blood glucose is taken between 8-9 am. Her breakfast blood glucose range is generally 130-140 mg/dl. An ACE inhibitor/angiotensin II receptor blocker is being taken.  Thyroid Problem Presents for follow-up visit. Symptoms include anxiety, cold intolerance, depressed mood and diarrhea. Patient reports no constipation, diaphoresis, dry skin, fatigue, hair loss, heat intolerance, hoarse voice, leg swelling, menstrual problem, nail problem, palpitations, tremors, visual change, weight gain or weight loss. The symptoms have been stable.  Hypertension This is a chronic problem. The current episode started more than 1 month ago. The problem is unchanged. The problem is uncontrolled. Pertinent negatives include no chest pain, headaches, orthopnea, palpitations, PND or shortness of breath. Identifiable causes of hypertension include a thyroid problem.    Lab Results  Component Value Date   NA  141 03/18/2022   K 4.5 03/18/2022   CO2 19 (L) 03/18/2022   GLUCOSE 140 (H) 03/18/2022   BUN 8 03/18/2022   CREATININE 0.66 03/18/2022   CALCIUM 10.2 03/18/2022   EGFR 91 03/18/2022   GFRNONAA 89 12/18/2020   Lab Results  Component Value Date   CHOL 193 03/18/2022   HDL 60 03/18/2022   LDLCALC 109 (H) 03/18/2022   TRIG 138 03/18/2022   CHOLHDL 3.1 08/23/2018   Lab Results  Component Value Date   TSH 2.770 07/10/2021   Lab Results  Component Value Date   HGBA1C 6.8 (H) 03/18/2022   No results found for: "WBC", "HGB", "HCT", "MCV", "PLT" Lab Results  Component Value Date   ALT 11 03/18/2022   AST 19 03/18/2022   GGT 29 05/21/2022   ALKPHOS 100 05/21/2022   BILITOT 0.5 03/18/2022   No results found for: "25OHVITD2", "25OHVITD3", "VD25OH"   Review of Systems  Constitutional:  Negative for diaphoresis, fatigue, weight gain and weight loss.  HENT:  Negative for hoarse voice.   Respiratory:  Negative for shortness of breath.   Cardiovascular:  Negative for chest pain, palpitations, orthopnea and PND.  Gastrointestinal:  Positive for diarrhea. Negative for constipation.  Endocrine: Positive for cold intolerance. Negative for heat intolerance.  Genitourinary:  Negative for menstrual problem.  Neurological:  Negative for tremors and headaches.  Psychiatric/Behavioral:  The patient is nervous/anxious.     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    Allergies  Allergen Reactions    Azithromycin Rash   Hydrocodone-Acetaminophen Nausea Only and Rash   Penicillins Other (See Comments)    Does not work for patient    Past Surgical History:  Procedure Laterality Date   BREAST BIOPSY Right 2004   neg   CHOLECYSTECTOMY     COLONOSCOPY  2015   repeat 10 yrs- Dr Allen Norris   MENISCUS REPAIR Bilateral    Right x2, Left x1    Social History   Tobacco Use   Smoking status: Never   Smokeless tobacco: Never  Vaping Use   Vaping Use: Never used  Substance Use Topics   Alcohol use: Yes    Alcohol/week: 3.0 standard drinks of alcohol    Types: 3 Glasses of wine per week   Drug use: No     Medication list has been reviewed and updated.  Current Meds  Medication Sig   cholestyramine light (PREVALITE) 4 GM/DOSE powder TAKE 1 TEASPOONFUL BY MOUTH EVERY DAY AS DIRECTED   ezetimibe (ZETIA) 10 MG tablet TAKE 1 TABLET BY MOUTH EVERY DAY   glipiZIDE (GLUCOTROL XL) 2.5 MG 24 hr tablet TAKE 1 TABLET BY MOUTH EVERY DAY WITH BREAKFAST   hyoscyamine (LEVSIN SL) 0.125 MG SL tablet Place 1 tablet (0.125 mg total) under the tongue every 4 (four) hours as needed.   levothyroxine (SYNTHROID) 25 MCG tablet Take 1 tablet (25 mcg total) by mouth daily.   metFORMIN (GLUCOPHAGE-XR) 750 MG 24 hr tablet TAKE 1 TABLET BY MOUTH EVERY DAY WITH BREAKFAST   pantoprazole (PROTONIX) 40 MG tablet Take 1 tablet (40 mg total) by mouth daily.   triamcinolone (NASACORT) 55 MCG/ACT AERO nasal inhaler Place 2 sprays into the nose daily.       07/24/2022    8:23 AM 03/18/2022    9:34 AM 01/17/2022    9:40 AM 11/05/2021   11:52 AM  GAD 7 : Generalized Anxiety Score  Nervous, Anxious, on Edge 1 0 0 0  Control/stop worrying 1 2 0 0  Worry too much - different things 1 0 0 0  Trouble relaxing 1 2 0 0  Restless 0 0 0 0  Easily annoyed or irritable 1 0 0 0  Afraid - awful might happen 0 0 0 0  Total GAD 7 Score 5 4 0 0  Anxiety Difficulty Not difficult at all Not difficult at all Not difficult at all Not  difficult at all       07/24/2022    8:22 AM 03/18/2022    9:33 AM 01/17/2022    9:40 AM  Depression screen PHQ 2/9  Decreased Interest 0 0 0  Down, Depressed, Hopeless 0 1 0  PHQ - 2 Score 0 1 0  Altered sleeping 2 0 1  Tired, decreased energy _0 Change in appetite 0 2 0  Feeling bad or failure about yourself  0 0 0  Trouble concentrating 0 0 0  Moving slowly or fidgety/restless 0 0 0  Suicidal thoughts 0 0 0  PHQ-9 Score _1 Difficult doing work/chores Not difficult at all Not difficult at all Not difficult at all    BP Readings from Last 3 Encounters:  07/24/22 (!) 160/84  05/21/22 (!) 159/79  03/18/22 (!) 160/100    Physical Exam Vitals and nursing note reviewed. Exam conducted with a chaperone present.  Constitutional:  General: She is not in acute distress.    Appearance: She is not diaphoretic.  HENT:     Head: Normocephalic and atraumatic.     Right Ear: External ear normal.     Left Ear: External ear normal.     Nose: Nose normal.  Eyes:     General:        Right eye: No discharge.        Left eye: No discharge.     Conjunctiva/sclera: Conjunctivae normal.     Pupils: Pupils are equal, round, and reactive to light.  Neck:     Thyroid: No thyroid mass, thyromegaly or thyroid tenderness.     Vascular: No JVD.  Cardiovascular:     Rate and Rhythm: Normal rate and regular rhythm.     Heart sounds: Normal heart sounds and S1 normal. No murmur heard.    No systolic murmur is present.     No diastolic murmur is present.     No friction rub. No gallop. No S3 or S4 sounds.  Pulmonary:     Effort: Pulmonary effort is normal.     Breath sounds: Normal breath sounds.  Abdominal:     General: Bowel sounds are normal.     Palpations: Abdomen is soft. There is no mass.     Tenderness: There is no abdominal tenderness. There is no guarding.  Musculoskeletal:        General: Normal range of motion.     Cervical back: Normal range of motion and neck supple.   Lymphadenopathy:     Cervical: No cervical adenopathy.  Skin:    General: Skin is warm and dry.  Neurological:     Mental Status: She is alert.     Wt Readings from Last 3 Encounters:  07/24/22 149 lb (67.6 kg)  05/21/22 145 lb (65.8 kg)  03/18/22 145 lb (65.8 kg)    BP (!) 160/84   Pulse 87   Ht _0  (1.676 m)   Wt 149 lb (67.6 kg)   BMI 24.05 kg/m   Assessment and Plan:  1. Type 2 diabetes mellitus without complication, without long-term current use of insulin (HCC) Chronic.  Controlled.  Stable.  Continue glipizide XL 0.5 mg 1 XR 750 XR once a day.  Will check A1c current status of control. - glipiZIDE (GLUCOTROL XL) 2.5 MG 24 hr tablet; TAKE 1 TABLET BY MOUTH EVERY DAY WITH BREAKFAST  Dispense: 90 tablet; Refill: 1 - metFORMIN (GLUCOPHAGE-XR) 750 MG 24 hr tablet; TAKE 1 TABLET BY MOUTH EVERY DAY WITH BREAKFAST  Dispense: 90 tablet; Refill: 1 - HgB A1c  2. Adult hypothyroidism Chronic.  Controlled.  Stable.  Will check thyroid panel TSH and determine level of correction with levothyroxine if necessary. - Thyroid Panel With TSH  3. Primary hypertension Chronic.  Persistent.  Stable.  But remains out of control range.  Discussion with patient importance of being on a ACE/ARB we will initiate losartan hydrochlorothiazide 5012.5 mg once a day and will recheck in 8 weeks.   - losartan-hydrochlorothiazide (HYZAAR) 50-12.5 MG tablet; Take 1 tablet by mouth daily.  Dispense: 90 tablet; Refill: 1  4. Abrasion Abrasion on the left hand where she had biopsy otology.  Some mild erythema and will use some Bactroban twice a day. - mupirocin ointment (BACTROBAN) 2 %; Apply 1 Application topically 2 (two) times daily.  Dispense: 22 g; Refill: 0    Otilio Miu, MD

## 2022-07-25 ENCOUNTER — Other Ambulatory Visit: Payer: Self-pay

## 2022-07-25 DIAGNOSIS — E119 Type 2 diabetes mellitus without complications: Secondary | ICD-10-CM

## 2022-07-25 LAB — HEMOGLOBIN A1C
Est. average glucose Bld gHb Est-mCnc: 160 mg/dL
Hgb A1c MFr Bld: 7.2 % — ABNORMAL HIGH (ref 4.8–5.6)

## 2022-07-25 LAB — THYROID PANEL WITH TSH
Free Thyroxine Index: 2.3 (ref 1.2–4.9)
T3 Uptake Ratio: 25 % (ref 24–39)
T4, Total: 9.1 ug/dL (ref 4.5–12.0)
TSH: 4.27 u[IU]/mL (ref 0.450–4.500)

## 2022-07-25 MED ORDER — GLIPIZIDE ER 5 MG PO TB24: ORAL_TABLET | ORAL | 0 refills | Status: DC

## 2022-09-10 ENCOUNTER — Other Ambulatory Visit: Payer: Self-pay | Admitting: Family Medicine

## 2022-09-10 DIAGNOSIS — K580 Irritable bowel syndrome with diarrhea: Secondary | ICD-10-CM

## 2022-09-10 NOTE — Telephone Encounter (Signed)
Requested Prescriptions  Pending Prescriptions Disp Refills  . cholestyramine light (PREVALITE) 4 GM/DOSE powder [Pharmacy Med Name: CHOLESTYRAMINE LIGHT POWDER] 231 g 0    Sig: TAKE 1 TEAPOONFUL BY MOUTH EVERY DAY AS DIRECTED     Cardiovascular:  Antilipid - Bile Acid Sequestrants Failed - 09/10/2022 12:34 PM      Failed - Lipid Panel in normal range within the last 12 months    Cholesterol, Total  Date Value Ref Range Status  03/18/2022 193 100 - 199 mg/dL Final   LDL Chol Calc (NIH)  Date Value Ref Range Status  03/18/2022 109 (H) 0 - 99 mg/dL Final   HDL  Date Value Ref Range Status  03/18/2022 60 >39 mg/dL Final   Triglycerides  Date Value Ref Range Status  03/18/2022 138 0 - 149 mg/dL Final         Passed - Valid encounter within last 12 months    Recent Outpatient Visits          1 month ago Type 2 diabetes mellitus without complication, without long-term current use of insulin ([place])   [place] [facility] [place], [place], MD   5 months ago Type 2 diabetes mellitus without complication, without long-term current use of insulin ([place])   [place] [facility] at [place], [place], MD   7 months ago RUQ pain   [place] [facility] at [place], Deanna C, MD   10 months ago Cystitis   [facility] at [place], Deanna C, MD   1 year ago [place] [facility] at [place], [place], MD      Future Appointments            In 1 week Juline Patch, MD [facility] and [facility] Medicine at [facility], [place]   In 2 months Juline Patch, MD [facility] at [facility], [facility]

## 2022-09-16 ENCOUNTER — Other Ambulatory Visit: Payer: Self-pay | Admitting: Family Medicine

## 2022-09-16 DIAGNOSIS — K760 Fatty (change of) liver, not elsewhere classified: Secondary | ICD-10-CM

## 2022-09-16 DIAGNOSIS — E7801 Familial hypercholesterolemia: Secondary | ICD-10-CM

## 2022-09-19 DIAGNOSIS — H6123 Impacted cerumen, bilateral: Secondary | ICD-10-CM | POA: Diagnosis not present

## 2022-09-19 DIAGNOSIS — H900 Conductive hearing loss, bilateral: Secondary | ICD-10-CM | POA: Diagnosis not present

## 2022-09-22 ENCOUNTER — Ambulatory Visit: Payer: Medicare Other | Admitting: Family Medicine

## 2022-09-24 ENCOUNTER — Other Ambulatory Visit: Payer: Self-pay | Admitting: Family Medicine

## 2022-09-24 DIAGNOSIS — K580 Irritable bowel syndrome with diarrhea: Secondary | ICD-10-CM

## 2022-09-25 NOTE — Telephone Encounter (Signed)
Requested medication (s) are due for refill today:unsure  Requested medication (s) are on the active medication list: yes  Last refill:  09/10/22  Future visit scheduled: yes  Notes to clinic:  Unable to refill per protocol, pharmacy request 90 day supply.     Requested Prescriptions  Pending Prescriptions Disp Refills   cholestyramine light (PREVALITE) 4 GM/DOSE powder [Pharmacy Med Name: CHOLESTYRAMINE LIGHT POWDER] 718.2 g 1    Sig: TAKE 1 TEAPOONFUL BY MOUTH EVERY DAY AS DIRECTED     Cardiovascular:  Antilipid - Bile Acid Sequestrants Failed - 09/24/2022 12:53 PM      Failed - Lipid Panel in normal range within the last 12 months    Cholesterol, Total  Date Value Ref Range Status  03/18/2022 193 100 - 199 mg/dL Final   LDL Chol Calc (NIH)  Date Value Ref Range Status  03/18/2022 109 (H) 0 - 99 mg/dL Final   HDL  Date Value Ref Range Status  03/18/2022 60 >39 mg/dL Final   Triglycerides  Date Value Ref Range Status  03/18/2022 138 0 - 149 mg/dL Final         Passed - Valid encounter within last 12 months    Recent Outpatient Visits           2 months ago Type 2 diabetes mellitus without complication, without long-term current use of insulin ([place])   [place] [facility] [place], [place], MD   6 months ago Type 2 diabetes mellitus without complication, without long-term current use of insulin ([place])   [place] [facility] at [place], [place], MD   8 months ago RUQ pain   [place] [facility] at [place], Deanna C, MD   10 months ago Cystitis   [facility] at [place], Deanna C, MD   1 year ago [place] [facility] at [place], [place], MD       Future Appointments             In 1 month Juline Patch, MD [facility] Care and [facility]  Medicine at [facility], [place]   In 2 months Juline Patch, MD [facility] and [facility] Medicine at [facility], [facility]

## 2022-10-09 ENCOUNTER — Other Ambulatory Visit: Payer: Self-pay | Admitting: Family Medicine

## 2022-10-09 DIAGNOSIS — E119 Type 2 diabetes mellitus without complications: Secondary | ICD-10-CM

## 2022-10-18 ENCOUNTER — Other Ambulatory Visit: Payer: Self-pay | Admitting: Family Medicine

## 2022-10-18 DIAGNOSIS — E119 Type 2 diabetes mellitus without complications: Secondary | ICD-10-CM

## 2022-10-18 DIAGNOSIS — K219 Gastro-esophageal reflux disease without esophagitis: Secondary | ICD-10-CM

## 2022-10-20 NOTE — Telephone Encounter (Signed)
Requested Prescriptions  Pending Prescriptions Disp Refills   pantoprazole (PROTONIX) 40 MG tablet [Pharmacy Med Name: PANTOPRAZOLE SOD DR 40 MG TAB] 90 tablet 3    Sig: TAKE 1 TABLET BY MOUTH EVERY DAY     Gastroenterology: Proton Pump Inhibitors Passed - 10/18/2022  9:53 AM      Passed - Valid encounter within last 12 months    Recent Outpatient Visits           2 months ago Type 2 diabetes mellitus without complication, without long-term current use of insulin ([place])   [place] [facility] at [place], [place], MD   7 months ago Type 2 diabetes mellitus without complication, without long-term current use of insulin ([place])   [place] [facility] at [place], [place], MD   9 months ago RUQ pain   [place] [facility] at [place], Deanna C, MD   11 months ago Cystitis   [place] [facility] at [place], Deanna C, MD   1 year ago [place] [facility] at [facility], MD       Future Appointments             In 1 week Juline Patch, MD [facility] Primary [facility] and [facility] at [facility], [place]   In 1 month Juline Patch, MD [facility] Primary Care and Sports Medicine at [place], [facility]             metFORMIN (GLUCOPHAGE-XR) 750 MG 24 hr tablet [Pharmacy Med Name: METFORMIN HCL ER 750 MG TABLET] 90 tablet 1    Sig: TAKE 1 TABLET BY [place]     Endocrinology:  Diabetes - Biguanides Failed - 10/18/2022  9:53 AM      Failed - B12 Level in normal range and within 720 days    No results found for: "VITAMINB12"       Failed - CBC within normal limits and completed in the last 12 months    No results found for: "WBC", "WBCKUC" No results found for: "RBC", "RBCKUC" No results found for: "HGB", "HGBKUC", "HGBPOCKUC", "HGBOTHER", "TOTHGB",  "HGBPLASMA", "LABHEMOF" No results found for: "HCT", "HCTKUC", "SRHCT" No results found for: "MCHC", "[place]" No results found for: "MCH", "[place]" No results found for: "MCVKUC", "MCV" No results found for: "PLTCOUNTKUC", "LABPLAT", "POCPLA" No results found for: "RDW", "RDWKUC", "POCRDW"       Passed - Cr in normal range and within 360 days    Creatinine, Ser  Date Value Ref Range Status  03/18/2022 0.66 0.57 - 1.00 mg/dL Final         Passed - HBA1C is between 0 and 7.9 and within 180 days    Hgb A1c MFr Bld  Date Value Ref Range Status  07/24/2022 7.2 (H) 4.8 - 5.6 % Final    Comment:             Prediabetes: 5.7 - 6.4          Diabetes: >6.4          Glycemic control for adults with diabetes: <7.0          Passed - eGFR in normal range and within 360 days    GFR calc Af Amer  Date Value Ref Range Status  12/18/2020 103 >59 mL/min/1.73  Final    Comment:    **In accordance with recommendations from the NKF-ASN Task force,**   [facility] is in the process of updating its eGFR calculation to the   2021 CKD-EPI creatinine equation that estimates kidney function   without a race variable.    GFR calc non Af Amer  Date Value Ref Range Status  12/18/2020 89 >59 mL/min/1.73 Final   eGFR  Date Value Ref Range Status  03/18/2022 91 >59 mL/min/1.73 Final         Passed - Valid encounter within last 6 months    Recent Outpatient Visits           2 months ago Type 2 diabetes mellitus without complication, without long-term current use of insulin ([place])   [place] [facility] at [place], [place], MD   7 months ago Type 2 diabetes mellitus without complication, without long-term current use of insulin ([place])   [place] [facility] at [place], [place], MD   9 months ago RUQ pain    Primary Care and Sports Medicine at [place], Deanna C, MD   11 months ago Cystitis   [facility] at [place], Deanna C, MD   1 year ago [place] [facility] at [facility], MD       Future Appointments             In 1 week Juline Patch, MD [facility] Primary Care and Sports Medicine at [facility], [place]   In 1 month Juline Patch, MD [facility] Primary Care and Sports Medicine at [facility], [facility]

## 2022-10-27 ENCOUNTER — Encounter: Payer: Self-pay | Admitting: Family Medicine

## 2022-10-27 ENCOUNTER — Ambulatory Visit (INDEPENDENT_AMBULATORY_CARE_PROVIDER_SITE_OTHER): Payer: Medicare Other | Admitting: Family Medicine

## 2022-10-27 VITALS — BP 162/88 | HR 84 | Ht 66.0 in | Wt 152.0 lb

## 2022-10-27 DIAGNOSIS — E119 Type 2 diabetes mellitus without complications: Secondary | ICD-10-CM | POA: Diagnosis not present

## 2022-10-27 DIAGNOSIS — I10 Essential (primary) hypertension: Secondary | ICD-10-CM | POA: Diagnosis not present

## 2022-10-27 MED ORDER — GLIPIZIDE 5 MG PO TABS: 2.5000 mg | ORAL_TABLET | Freq: Two times a day (BID) | ORAL | 1 refills | Status: DC

## 2022-10-27 MED ORDER — LOSARTAN POTASSIUM-HCTZ 50-12.5 MG PO TABS: 1.0000 | ORAL_TABLET | Freq: Every day | ORAL | 1 refills | Status: DC

## 2022-10-27 NOTE — Progress Notes (Signed)
  Date:  10/27/2022   Name:  Rebecca Robbins   DOB:  01/28/1946   MRN:  [id number]   Chief Complaint: Hypertension and Diabetes  Hypertension This is a chronic problem. The problem has been gradually improving since onset. Pertinent negatives include no anxiety, blurred vision, chest pain, headaches, malaise/fatigue, neck pain, orthopnea, palpitations, peripheral edema, PND, shortness of breath or sweats.  Diabetes Pertinent negatives for hypoglycemia include no headaches or sweats. Pertinent negatives for diabetes include no blurred vision and no chest pain.    Lab Results  Component Value Date   NA 141 03/18/2022   K 4.5 03/18/2022   CO2 19 (L) 03/18/2022   GLUCOSE 140 (H) 03/18/2022   BUN 8 03/18/2022   CREATININE 0.66 03/18/2022   CALCIUM 10.2 03/18/2022   EGFR 91 03/18/2022   GFRNONAA 89 12/18/2020   Lab Results  Component Value Date   CHOL 193 03/18/2022   HDL 60 03/18/2022   LDLCALC 109 (H) 03/18/2022   TRIG 138 03/18/2022   CHOLHDL 3.1 08/23/2018   Lab Results  Component Value Date   TSH 4.270 07/24/2022   Lab Results  Component Value Date   HGBA1C 7.2 (H) 07/24/2022   No results found for: "WBC", "HGB", "HCT", "MCV", "PLT" Lab Results  Component Value Date   ALT 11 03/18/2022   AST 19 03/18/2022   GGT 29 05/21/2022   ALKPHOS 100 05/21/2022   BILITOT 0.5 03/18/2022   No results found for: "25OHVITD2", "25OHVITD3", "VD25OH"   Review of Systems  Constitutional:  Negative for malaise/fatigue.  Eyes:  Negative for blurred vision.  Respiratory:  Negative for cough, chest tightness, shortness of breath and wheezing.   Cardiovascular:  Negative for chest pain, palpitations, orthopnea, leg swelling and PND.  Musculoskeletal:  Negative for neck pain.  Neurological:  Negative for headaches.    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    Allergies  Allergen Reactions   Azithromycin Rash   Hydrocodone-Acetaminophen Nausea Only and Rash   Penicillins Other (See Comments)    Does not work for patient    Past Surgical History:  Procedure Laterality Date   BREAST BIOPSY Right 2004   neg   CHOLECYSTECTOMY     COLONOSCOPY  2015   repeat 10 yrs- Dr Wohl   MENISCUS REPAIR Bilateral    Right x2, Left x1    Social History   Tobacco Use   Smoking status: Never   Smokeless tobacco: Never  Vaping Use   Vaping Use: Never used  Substance Use Topics   Alcohol use: Yes    Alcohol/week: 3.0 standard drinks of alcohol    Types: 3 Glasses of wine per week   Drug use: No     Medication list has been reviewed and updated.  Current Meds  Medication Sig   cholestyramine light (PREVALITE) 4 GM/DOSE powder TAKE 1 TEAPOONFUL BY MOUTH EVERY DAY AS DIRECTED   ezetimibe (ZETIA) 10 MG tablet TAKE 1 TABLET BY MOUTH EVERY DAY   glipiZIDE (GLUCOTROL XL)   5 MG 24 hr tablet TAKE 1 TABLET BY MOUTH EVERY DAY WITH BREAKFAST   hyoscyamine (LEVSIN SL) 0.125 MG SL tablet Place 1 tablet (0.125 mg total) under the tongue every 4 (four) hours as needed.   levothyroxine (SYNTHROID) 25 MCG tablet Take 1 tablet (25 mcg total) by mouth daily.   losartan-hydrochlorothiazide (HYZAAR) 50-12.5 MG tablet Take 1 tablet by mouth daily.   metFORMIN (GLUCOPHAGE-XR) 750 MG 24 hr tablet TAKE 1 TABLET BY MOUTH EVERY DAY WITH BREAKFAST   mupirocin ointment (BACTROBAN) 2 % Apply 1 Application topically 2 (two) times daily.   pantoprazole (PROTONIX) 40 MG  tablet TAKE 1 TABLET BY MOUTH EVERY DAY   triamcinolone (NASACORT) 55 MCG/ACT AERO nasal inhaler Place 2 sprays into the nose daily.       10/27/2022   11:27 AM 07/24/2022    8:23 AM 03/18/2022    9:34 AM 01/17/2022    9:40 AM  GAD 7 : Generalized Anxiety Score  Nervous, Anxious, on Edge 0 1 0 0  Control/stop worrying 0 1 2 0  Worry too much - different things 0 1 0 0  Trouble relaxing 0 1 2 0  Restless 0 0 0 0  Easily annoyed or irritable 0 1 0 0  Afraid - awful might happen 0 0 0 0  Total GAD 7 Score 0 5 4 0  Anxiety Difficulty Not difficult at all Not difficult at all Not difficult at all Not difficult at all       10/27/2022   11:27 AM 07/24/2022    8:22 AM 03/18/2022    9:33 AM  Depression screen PHQ 2/9  Decreased Interest 0 0 0  Down, Depressed, Hopeless 0 0 1  PHQ - 2 Score 0 0 1  Altered sleeping 0 2 0  Tired, decreased energy 0 1 3  Change in appetite 0 0 2  Feeling bad or failure about yourself  0 0 0  Trouble concentrating 0 0 0  Moving slowly or fidgety/restless 0 0 0  Suicidal thoughts 0 0 0  PHQ-9 Score 0 3 6  Difficult doing work/chores Not difficult at all Not difficult at all Not difficult at all    BP Readings from Last 3 Encounters:  10/27/22 (!) 162/88  07/24/22 (!) 160/84  05/21/22 (!) 159/79    Physical Exam Vitals and nursing note reviewed. Exam conducted with a chaperone present.  Constitutional:      General: She is not in acute distress.    Appearance: She is not diaphoretic.  HENT:     Head: Normocephalic and atraumatic.     Right Ear: Tympanic membrane and external ear normal.     Left Ear: Tympanic membrane and external ear normal.     Nose: Nose normal.     Mouth/Throat:     Mouth: Mucous membranes are moist.  Eyes:     General:        Right eye: No discharge.        Left eye: No discharge.     Conjunctiva/sclera: Conjunctivae normal.     Pupils: Pupils are equal, round, and reactive to light.  Neck:     Thyroid: No  thyromegaly.     Vascular: No JVD.  Cardiovascular:     Rate and Rhythm: Normal rate and regular rhythm.     Heart sounds: Normal heart sounds. No murmur heard.    No friction rub. No gallop.  Pulmonary:     Effort: Pulmonary effort is  normal.     Breath sounds: Normal breath sounds. No wheezing or rhonchi.  Abdominal:     General: Bowel sounds are normal.     Palpations: Abdomen is soft. There is no mass.     Tenderness: There is no abdominal tenderness. There is no guarding.  Musculoskeletal:        General: Normal range of motion.     Cervical back: Normal range of motion and neck supple.  Lymphadenopathy:     Cervical: No cervical adenopathy.  Skin:    General: Skin is warm and dry.     Coloration: Skin is not jaundiced or pale.     Findings: No bruising, erythema, lesion or rash.  Neurological:     Mental Status: She is alert.     Deep Tendon Reflexes: Reflexes are normal and symmetric.     Wt Readings from Last 3 Encounters:  10/27/22 152 lb (68.9 kg)  07/24/22 149 lb (67.6 kg)  05/21/22 145 lb (65.8 kg)    BP (!) 162/88 (BP Location: Left Arm)   Pulse 84   Ht 5' 6" (1.676 m)   Wt 152 lb (68.9 kg)   SpO2 98%   BMI 24.53 kg/m   Assessment and Plan:  1. Type 2 diabetes mellitus without complication, without long-term current use of insulin ([place]) New onset.  Patient has been unable to tolerate glipizide 5 mg twice a day so we will convert to 2.5 mg dosing which is 1/2 tablet twice a day.  To see how she tolerates this.  We will recheck patient in 6 to 8 weeks for A1c. - HgB A1c - glipiZIDE (GLUCOTROL) 5 MG tablet; Take 0.5 tablets (2.5 mg total) by mouth 2 (two) times daily before a meal.  Dispense: 180 tablet; Refill: 1 - Urine microalbumin-creatinine with uACR  2. Primary hypertension Chronic.  Controlled.  Stable.  Blood pressure elevated at 162/88 but patient brings in readings including 1 from Dr. Kathyrn Sheriff office which is acceptable.  We will continue  losartan hydrochlorothiazide 50-12.5 mg daily. - losartan-hydrochlorothiazide (HYZAAR) 50-12.5 MG tablet; Take 1 tablet by mouth daily.  Dispense: 90 tablet; Refill: 1    Otilio Miu, MD

## 2022-10-28 DIAGNOSIS — E119 Type 2 diabetes mellitus without complications: Secondary | ICD-10-CM | POA: Diagnosis not present

## 2022-10-29 LAB — MICROALBUMIN / CREATININE URINE RATIO
Creatinine, Urine: 28.1 mg/dL
Microalb/Creat Ratio: 12 mg/g creat (ref 0–29)
Microalbumin, Urine: 3.4 ug/mL

## 2022-10-29 LAB — HEMOGLOBIN A1C
Est. average glucose Bld gHb Est-mCnc: 137 mg/dL
Hgb A1c MFr Bld: 6.4 % — ABNORMAL HIGH (ref 4.8–5.6)

## 2022-11-22 ENCOUNTER — Other Ambulatory Visit: Payer: Self-pay | Admitting: Family Medicine

## 2022-11-22 DIAGNOSIS — E119 Type 2 diabetes mellitus without complications: Secondary | ICD-10-CM

## 2022-11-24 NOTE — Telephone Encounter (Signed)
Requested Prescriptions  Pending Prescriptions Disp Refills   metFORMIN (GLUCOPHAGE-XR) 750 MG 24 hr tablet [Pharmacy Med Name: METFORMIN HCL ER 750 MG TABLET] 90 tablet 0    Sig: TAKE 1 TABLET BY MOUTH EVERY DAY WITH BREAKFAST     Endocrinology:  Diabetes - Biguanides Failed - 11/22/2022  2:28 PM      Failed - B12 Level in normal range and within 720 days    No results found for: "VITAMINB12"       Failed - CBC within normal limits and completed in the last 12 months    No results found for: "WBC", "[place]" No results found for: "RBC", "RBCKUC" No results found for: "HGB", "HGBKUC", "HGBPOCKUC", "HGBOTHER", "TOTHGB", "HGBPLASMA", "LABHEMOF" No results found for: "HCT", "HCTKUC", "SRHCT" No results found for: "MCHC", "[place]" No results found for: "MCH", "[place]" No results found for: "MCVKUC", "MCV" No results found for: "PLTCOUNTKUC", "LABPLAT", "POCPLA" No results found for: "RDW", "RDWKUC", "POCRDW"       Passed - Cr in normal range and within 360 days    Creatinine, Ser  Date Value Ref Range Status  03/18/2022 0.66 0.57 - 1.00 mg/dL Final         Passed - HBA1C is between 0 and 7.9 and within 180 days    Hgb A1c MFr Bld  Date Value Ref Range Status  10/28/2022 6.4 (H) 4.8 - 5.6 % Final    Comment:             Prediabetes: 5.7 - 6.4          Diabetes: >6.4          Glycemic control for adults with diabetes: <7.0          Passed - eGFR in normal range and within 360 days    GFR calc Af Amer  Date Value Ref Range Status  12/18/2020 103 >59 mL/min/1.73 Final    Comment:    **In accordance with recommendations from the NKF-ASN Task force,**   [facility] is in the process of updating its eGFR calculation to the   2021 CKD-EPI creatinine equation that estimates kidney function   without a race variable.    GFR calc non Af Amer  Date Value Ref Range Status  12/18/2020 89 >59 mL/min/1.73 Final   eGFR  Date Value Ref Range Status  03/18/2022 91 >59 mL/min/1.73 Final          Passed - Valid encounter within last 6 months    Recent Outpatient Visits           4 weeks ago Primary hypertension   [place] [facility] at [facility], MD   4 months ago Type 2 diabetes mellitus without complication, without long-term current use of insulin ([place])   [place] [facility] at [facility], MD   8 months ago Type 2 diabetes mellitus without complication, without long-term current use of insulin ([place])   [place] [facility] at [place], Deanna C, MD   10 months ago RUQ pain   [place] [facility] at [place], Deanna C, MD   1 year ago Cystitis   [facility] Primary Care and Sports Medicine at [place], [place], MD       Future Appointments             In 3 months Juline Patch,  [place] [facility] at [facility], [facility]              

## 2022-11-28 ENCOUNTER — Ambulatory Visit: Payer: Medicare Other | Admitting: Family Medicine

## 2022-12-15 ENCOUNTER — Other Ambulatory Visit: Payer: Self-pay | Admitting: Family Medicine

## 2022-12-15 DIAGNOSIS — K760 Fatty (change of) liver, not elsewhere classified: Secondary | ICD-10-CM

## 2022-12-15 DIAGNOSIS — E7801 Familial hypercholesterolemia: Secondary | ICD-10-CM

## 2022-12-19 ENCOUNTER — Other Ambulatory Visit: Payer: Self-pay | Admitting: Family Medicine

## 2022-12-19 DIAGNOSIS — R5383 Other fatigue: Secondary | ICD-10-CM

## 2022-12-19 DIAGNOSIS — E039 Hypothyroidism, unspecified: Secondary | ICD-10-CM

## 2022-12-19 NOTE — Telephone Encounter (Signed)
Future visit in 2 months . Requested Prescriptions  Pending Prescriptions Disp Refills   levothyroxine (SYNTHROID) 25 MCG tablet [Pharmacy Med Name: LEVOTHYROXINE 25 MCG TABLET] 90 tablet 0    Sig: TAKE 1 TABLET BY MOUTH EVERY DAY     Endocrinology:  Hypothyroid Agents Passed - 12/19/2022  1:33 AM      Passed - TSH in normal range and within 360 days    TSH  Date Value Ref Range Status  07/24/2022 4.270 0.450 - 4.500 uIU/mL Final         Passed - Valid encounter within last 12 months    Recent Outpatient Visits           1 month ago Primary hypertension   [place] [facility] at [facility], MD   4 months ago Type 2 diabetes mellitus without complication, without long-term current use of insulin ([place])   [place] [facility] at [facility], MD   9 months ago Type 2 diabetes mellitus without complication, without long-term current use of insulin ([place])   [place] [facility] Medicine at [place], [place], MD   11 months ago RUQ pain   [place] [facility] Medicine at [place], Deanna C, MD   1 year ago Cystitis   [facility] Primary Care and Sports Medicine at [place], [place], MD       Future Appointments             In 2 months Juline Patch, MD [place] [facility] Medicine at [facility], [facility]

## 2023-01-05 ENCOUNTER — Telehealth: Payer: Self-pay | Admitting: Family Medicine

## 2023-01-05 NOTE — Telephone Encounter (Signed)
Copied from [place] [phone]. Topic: Medicare AWV >> Jan 05, 2023  1:31 PM Devoria Glassing wrote: Reason for CRM: Attempted to schedule AWV. Unable to LVM.  Will try at later time.

## 2023-01-09 ENCOUNTER — Telehealth: Payer: Self-pay | Admitting: Family Medicine

## 2023-01-09 NOTE — Telephone Encounter (Signed)
Spoke with patient she req CB end of 01/2023

## 2023-01-26 ENCOUNTER — Other Ambulatory Visit: Payer: Self-pay | Admitting: Family Medicine

## 2023-01-26 DIAGNOSIS — K580 Irritable bowel syndrome with diarrhea: Secondary | ICD-10-CM

## 2023-01-26 NOTE — Telephone Encounter (Signed)
Medication Refill - Medication: cholestyramine light (PREVALITE) 4 GM/DOSE powder  Has the patient contacted their pharmacy? yes (Agent: If no, request that the patient contact the pharmacy for the refill. If patient does not wish to contact the pharmacy document the reason why and proceed with request.) (Agent: If yes, when and what did the pharmacy advise?)contact pcp  Preferred Pharmacy (with phone number or street name):  [place], [place]  Phone: [phone] Fax: [phone] Has the patient been seen for an appointment in the last year OR does the patient have an upcoming appointment? yes  Agent: Please be advised that RX refills may take up to 3 business days. We ask that you follow-up with your pharmacy.

## 2023-01-27 MED ORDER — CHOLESTYRAMINE LIGHT 4 GM/DOSE PO POWD: ORAL | 0 refills | Status: DC

## 2023-01-27 NOTE — Telephone Encounter (Signed)
Requested Prescriptions  Pending Prescriptions Disp Refills   cholestyramine light (PREVALITE) 4 GM/DOSE powder 231 g 0     Cardiovascular:  Antilipid - Bile Acid Sequestrants Failed - 01/26/2023 11:14 AM      Failed - Lipid Panel in normal range within the last 12 months    Cholesterol, Total  Date Value Ref Range Status  03/18/2022 193 100 - 199 mg/dL Final   LDL Chol Calc (NIH)  Date Value Ref Range Status  03/18/2022 109 (H) 0 - 99 mg/dL Final   HDL  Date Value Ref Range Status  03/18/2022 60 >39 mg/dL Final   Triglycerides  Date Value Ref Range Status  03/18/2022 138 0 - 149 mg/dL Final         Passed - Valid encounter within last 12 months    Recent Outpatient Visits           3 months ago Primary hypertension   [place] [facility] at [place], Deanna C, MD   6 months ago Type 2 diabetes mellitus without complication, without long-term current use of insulin ([place])   [place] [facility] Medicine at [place], Deanna C, MD   10 months ago Type 2 diabetes mellitus without complication, without long-term current use of insulin ([place])   [place] [facility] at [place], Deanna C, MD   1 year ago RUQ pain   [place] [facility] at [place], Deanna C, MD   1 year ago [facility], [place], MD       Future Appointments             In 1 month Juline Patch, MD [place] at [facility], [facility]

## 2023-01-30 ENCOUNTER — Telehealth: Payer: Self-pay | Admitting: Family Medicine

## 2023-01-30 NOTE — Telephone Encounter (Signed)
Copied from [place] [id number]. Topic: Medicare AWV  >> Jan 30, 2023 11:26 AM Devoria Glassing wrote: Reason for CRM: Left message tor patient to schedule Medicare Annual Wellness Visit (AWV) with [place], [place]  Appointment can be an offiice/telephone or virtual visit;  Please call [phone] ask for Juliann Pulse.

## 2023-02-25 ENCOUNTER — Other Ambulatory Visit: Payer: Self-pay | Admitting: Family Medicine

## 2023-02-25 DIAGNOSIS — E119 Type 2 diabetes mellitus without complications: Secondary | ICD-10-CM

## 2023-02-25 NOTE — Telephone Encounter (Signed)
Requested medication (s) are due for refill today: yes  Requested medication (s) are on the active medication list: yes  Last refill:  11/24/22 #90/0  Future visit scheduled: no  Notes to clinic:  Unable to refill per protocol due to failed labs, no updated results.    Requested Prescriptions  Pending Prescriptions Disp Refills   metFORMIN (GLUCOPHAGE-XR) 750 MG 24 hr tablet [Pharmacy Med Name: METFORMIN HCL ER 750 MG TABLET] 90 tablet 0    Sig: TAKE 1 TABLET BY MOUTH EVERY DAY WITH BREAKFAST     Endocrinology:  Diabetes - Biguanides Failed - 02/25/2023  1:50 AM      Failed - B12 Level in normal range and within 720 days    No results found for: "VITAMINB12"       Failed - CBC within normal limits and completed in the last 12 months    No results found for: "WBC", "WBCKUC" No results found for: "RBC", "RBCKUC" No results found for: "HGB", "HGBKUC", "HGBPOCKUC", "HGBOTHER", "TOTHGB", "HGBPLASMA", "LABHEMOF" No results found for: "HCT", "HCTKUC", "SRHCT" No results found for: "MCHC", "[place]" No results found for: "MCH", "[place]" No results found for: "MCVKUC", "MCV" No results found for: "PLTCOUNTKUC", "LABPLAT", "POCPLA" No results found for: "RDW", "RDWKUC", "POCRDW"       Passed - Cr in normal range and within 360 days    Creatinine, Ser  Date Value Ref Range Status  03/18/2022 0.66 0.57 - 1.00 mg/dL Final         Passed - HBA1C is between 0 and 7.9 and within 180 days    Hgb A1c MFr Bld  Date Value Ref Range Status  10/28/2022 6.4 (H) 4.8 - 5.6 % Final    Comment:             Prediabetes: 5.7 - 6.4          Diabetes: >6.4          Glycemic control for adults with diabetes: <7.0          Passed - eGFR in normal range and within 360 days    GFR calc Af Amer  Date Value Ref Range Status  12/18/2020 103 >59 mL/min/1.73 Final    Comment:    **In accordance with recommendations from the NKF-ASN Task force,**   [facility] is in the process of updating its eGFR  calculation to the   2021 CKD-EPI creatinine equation that estimates kidney function   without a race variable.    GFR calc non Af Amer  Date Value Ref Range Status  12/18/2020 89 >59 mL/min/1.73 Final   eGFR  Date Value Ref Range Status  03/18/2022 91 >59 mL/min/1.73 Final         Passed - Valid encounter within last 6 months    Recent Outpatient Visits           4 months ago Primary hypertension   [place] [facility] at [facility], MD   7 months ago Type 2 diabetes mellitus without complication, without long-term current use of insulin ([place])   [place] [facility] at [facility], MD   11 months ago Type 2 diabetes mellitus without complication, without long-term current use of insulin ([place])   [place] [facility] & Sports Medicine at [place], Deanna C, MD   1 year ago RUQ pain   [facility] Primary [facility] at [facility],  Iven Finn, MD   1 year ago [place] at [facility], MD

## 2023-02-26 ENCOUNTER — Ambulatory Visit: Payer: Medicare Other | Admitting: Family Medicine

## 2023-03-17 ENCOUNTER — Other Ambulatory Visit: Payer: Self-pay | Admitting: Family Medicine

## 2023-03-17 DIAGNOSIS — E7801 Familial hypercholesterolemia: Secondary | ICD-10-CM

## 2023-03-17 DIAGNOSIS — K760 Fatty (change of) liver, not elsewhere classified: Secondary | ICD-10-CM

## 2023-03-17 DIAGNOSIS — E039 Hypothyroidism, unspecified: Secondary | ICD-10-CM

## 2023-03-17 DIAGNOSIS — R5383 Other fatigue: Secondary | ICD-10-CM

## 2023-03-31 ENCOUNTER — Encounter: Payer: Self-pay | Admitting: Family Medicine

## 2023-03-31 ENCOUNTER — Ambulatory Visit (INDEPENDENT_AMBULATORY_CARE_PROVIDER_SITE_OTHER): Payer: Medicare Other | Admitting: Family Medicine

## 2023-03-31 VITALS — BP 128/78 | HR 84 | Ht 66.0 in | Wt 150.0 lb

## 2023-03-31 DIAGNOSIS — E039 Hypothyroidism, unspecified: Secondary | ICD-10-CM | POA: Diagnosis not present

## 2023-03-31 DIAGNOSIS — E781 Pure hyperglyceridemia: Secondary | ICD-10-CM

## 2023-03-31 DIAGNOSIS — R5383 Other fatigue: Secondary | ICD-10-CM

## 2023-03-31 DIAGNOSIS — E119 Type 2 diabetes mellitus without complications: Secondary | ICD-10-CM | POA: Diagnosis not present

## 2023-03-31 MED ORDER — LEVOTHYROXINE SODIUM 25 MCG PO TABS: 25.0000 ug | ORAL_TABLET | Freq: Every day | ORAL | 1 refills | Status: DC

## 2023-03-31 MED ORDER — METFORMIN HCL ER 750 MG PO TB24: ORAL_TABLET | ORAL | 0 refills | Status: DC

## 2023-03-31 MED ORDER — GLIPIZIDE 5 MG PO TABS: 5.0000 mg | ORAL_TABLET | Freq: Every day | ORAL | 1 refills | Status: DC

## 2023-03-31 NOTE — Progress Notes (Signed)
Date:  03/31/2023   Name:  Rebecca Robbins   DOB:  04-10-1946   MRN:  [id number]   Chief Complaint: Diabetes and Hypothyroidism  Diabetes She presents for her follow-up diabetic visit. She has type 2 diabetes mellitus. Her disease course has been stable. There are no hypoglycemic associated symptoms. Pertinent negatives for hypoglycemia include no nervousness/anxiousness or tremors. There are no diabetic associated symptoms. Pertinent negatives for diabetes include no chest pain, no fatigue, no polydipsia, no polyuria, no visual change and no weight loss. There are no hypoglycemic complications. Symptoms are stable. Current diabetic treatment includes oral agent (dual therapy). She is compliant with treatment all of the time. Her weight is stable. She is following a generally healthy diet. Meal planning includes avoidance of concentrated sweets and carbohydrate counting. She participates in exercise intermittently.  Thyroid Problem Presents for follow-up visit. Patient reports no anxiety, cold intolerance, constipation, fatigue, hair loss, tremors, visual change, weight gain or weight loss. The symptoms have been improving.    Lab Results  Component Value Date   NA 141 03/18/2022   K 4.5 03/18/2022   CO2 19 (L) 03/18/2022   GLUCOSE 140 (H) 03/18/2022   BUN 8 03/18/2022   CREATININE 0.66 03/18/2022   CALCIUM 10.2 03/18/2022   EGFR 91 03/18/2022   GFRNONAA 89 12/18/2020   Lab Results  Component Value Date   CHOL 193 03/18/2022   HDL 60 03/18/2022   LDLCALC 109 (H) 03/18/2022   TRIG 138 03/18/2022   CHOLHDL 3.1 08/23/2018   Lab Results  Component Value Date   TSH 4.270 07/24/2022   Lab Results  Component Value Date   HGBA1C 6.4 (H) 10/28/2022   No results found for: "WBC", "HGB", "HCT", "MCV", "PLT" Lab Results  Component Value Date   ALT 11 03/18/2022   AST 19 03/18/2022   GGT 29 05/21/2022   ALKPHOS 100 05/21/2022   BILITOT 0.5 03/18/2022   No results found for:  "25OHVITD2", "25OHVITD3", "VD25OH"   Review of Systems  Constitutional:  Negative for fatigue, weight gain and weight loss.  Cardiovascular:  Negative for chest pain.  Gastrointestinal:  Negative for constipation.  Endocrine: Negative for cold intolerance, polydipsia and polyuria.  Neurological:  Negative for tremors.  Psychiatric/Behavioral:  The patient is not nervous/anxious.     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    Allergies  Allergen Reactions   Azithromycin Rash   Hydrocodone-Acetaminophen Nausea Only and Rash   Penicillins Other (See Comments)    Does not work for patient    Past Surgical History:  Procedure Laterality Date   BREAST BIOPSY Right 2004   neg   CHOLECYSTECTOMY     COLONOSCOPY  2015   repeat 10 yrs- Dr Servando Snare   MENISCUS REPAIR Bilateral    Right x2, Left x1    Social History   Tobacco Use   Smoking status: Never   Smokeless tobacco: Never  Vaping Use   Vaping Use: Never used  Substance Use Topics   Alcohol use: Yes  Alcohol/week: 3.0 standard drinks of alcohol    Types: 3 Glasses of wine per week   Drug use: No     Medication list has been reviewed and updated.  Current Meds  Medication Sig   cholestyramine light (PREVALITE) 4 GM/DOSE powder TAKE 1 TEAPOONFUL BY MOUTH EVERY DAY AS DIRECTED   ezetimibe (ZETIA) 10 MG tablet TAKE 1 TABLET BY MOUTH EVERY DAY   glipiZIDE (GLUCOTROL) 5 MG tablet Take 0.5 tablets (2.5 mg total) by mouth  2 (two) times daily before a meal. (Patient taking differently: Take 5 mg by mouth daily before breakfast.)   levothyroxine (SYNTHROID) 25 MCG tablet TAKE 1 TABLET BY MOUTH EVERY DAY   losartan-hydrochlorothiazide (HYZAAR) 50-12.5 MG tablet Take 1 tablet by mouth daily.   metFORMIN (GLUCOPHAGE-XR) 750 MG 24 hr tablet TAKE 1 TABLET BY MOUTH EVERY DAY WITH BREAKFAST   mupirocin ointment (BACTROBAN) 2 % Apply 1 Application topically 2 (two) times daily.   pantoprazole (PROTONIX) 40 MG tablet TAKE 1 TABLET BY MOUTH EVERY DAY   triamcinolone (NASACORT) 55 MCG/ACT AERO nasal inhaler Place 2 sprays into the nose daily.       03/31/2023    9:55 AM 10/27/2022   11:27 AM 07/24/2022    8:23 AM 03/18/2022    9:34 AM  GAD 7 : Generalized Anxiety Score  Nervous, Anxious, on Edge 0 0 1 0  Control/stop worrying 0 0 1 2  Worry too much - different things 0 0 1 0  Trouble relaxing 0 0 1 2  Restless 0 0 0 0  Easily annoyed or irritable 0 0 1 0  Afraid - awful might happen 0 0 0 0  Total GAD 7 Score 0 0 5 4  Anxiety Difficulty Not difficult at all Not difficult at all Not difficult at all Not difficult at all       03/31/2023    9:55 AM 10/27/2022   11:27 AM 07/24/2022    8:22 AM  Depression screen PHQ 2/9  Decreased Interest 0 0 0  Down, Depressed, Hopeless 0 0 0  PHQ - 2 Score 0 0 0  Altered sleeping 2 0 2  Tired, decreased energy 1 0 1  Change in appetite 0 0 0  Feeling bad or failure about yourself  0 0 0  Trouble concentrating 0 0 0  Moving slowly or fidgety/restless 0 0 0  Suicidal thoughts 0 0 0  PHQ-9 Score 3 0 3  Difficult doing work/chores Somewhat difficult Not difficult at all Not difficult at all    BP Readings from Last 3 Encounters:  03/31/23 128/78  10/27/22 (!) 162/88  07/24/22 (!) 160/84    Physical Exam  Wt Readings from Last 3 Encounters:  03/31/23 150 lb (68 kg)  10/27/22 152 lb (68.9 kg)  07/24/22 149 lb (67.6 kg)    BP 128/78   Pulse 84   Ht 5\' 6"  (1.676 m)    Wt 150 lb (68 kg)   SpO2 99%   BMI 24.21 kg/m   Assessment and Plan:     Elizabeth Sauer, MD

## 2023-04-01 LAB — HEMOGLOBIN A1C
Est. average glucose Bld gHb Est-mCnc: 151 mg/dL
Hgb A1c MFr Bld: 6.9 % — ABNORMAL HIGH (ref 4.8–5.6)

## 2023-04-01 LAB — COMPREHENSIVE METABOLIC PANEL
ALT: 16 IU/L (ref 0–32)
AST: 21 IU/L (ref 0–40)
Albumin/Globulin Ratio: 1.9 (ref 1.2–2.2)
Albumin: 5 g/dL — ABNORMAL HIGH (ref 3.8–4.8)
Alkaline Phosphatase: 87 IU/L (ref 44–121)
BUN/Creatinine Ratio: 11 — ABNORMAL LOW (ref 12–28)
BUN: 8 mg/dL (ref 8–27)
Bilirubin Total: 0.6 mg/dL (ref 0.0–1.2)
CO2: 20 mmol/L (ref 20–29)
Calcium: 10.7 mg/dL — ABNORMAL HIGH (ref 8.7–10.3)
Chloride: 102 mmol/L (ref 96–106)
Creatinine, Ser: 0.7 mg/dL (ref 0.57–1.00)
Globulin, Total: 2.6 g/dL (ref 1.5–4.5)
Glucose: 138 mg/dL — ABNORMAL HIGH (ref 70–99)
Potassium: 4.6 mmol/L (ref 3.5–5.2)
Sodium: 142 mmol/L (ref 134–144)
Total Protein: 7.6 g/dL (ref 6.0–8.5)
eGFR: 89 mL/min/{1.73_m2} (ref >59)

## 2023-04-01 LAB — LIPID PANEL WITH LDL/HDL RATIO
Cholesterol, Total: 204 mg/dL — ABNORMAL HIGH (ref 100–199)
HDL: 76 mg/dL (ref >39)
LDL Chol Calc (NIH): 109 mg/dL — ABNORMAL HIGH (ref 0–99)
LDL/HDL Ratio: 1.4 ratio (ref 0.0–3.2)
Triglycerides: 106 mg/dL (ref 0–149)
VLDL Cholesterol Cal: 19 mg/dL (ref 5–40)

## 2023-04-01 LAB — THYROID PANEL WITH TSH
Free Thyroxine Index: 2.5 (ref 1.2–4.9)
T3 Uptake Ratio: 27 % (ref 24–39)
T4, Total: 9.3 ug/dL (ref 4.5–12.0)
TSH: 3.35 u[IU]/mL (ref 0.450–4.500)

## 2023-04-14 ENCOUNTER — Other Ambulatory Visit: Payer: Self-pay | Admitting: Family Medicine

## 2023-04-14 DIAGNOSIS — E7801 Familial hypercholesterolemia: Secondary | ICD-10-CM

## 2023-04-14 DIAGNOSIS — K760 Fatty (change of) liver, not elsewhere classified: Secondary | ICD-10-CM

## 2023-04-18 ENCOUNTER — Other Ambulatory Visit: Payer: Self-pay | Admitting: Family Medicine

## 2023-04-18 DIAGNOSIS — R5383 Other fatigue: Secondary | ICD-10-CM

## 2023-04-18 DIAGNOSIS — E039 Hypothyroidism, unspecified: Secondary | ICD-10-CM

## 2023-04-20 NOTE — Telephone Encounter (Signed)
Requested Prescriptions  Refused Prescriptions Disp Refills   levothyroxine (SYNTHROID) 25 MCG tablet [Pharmacy Med Name: LEVOTHYROXINE 25 MCG TABLET] 30 tablet     Sig: TAKE 1 TABLET BY MOUTH EVERY DAY     Endocrinology:  Hypothyroid Agents Passed - 04/18/2023  9:34 AM      Passed - TSH in normal range and within 360 days    TSH  Date Value Ref Range Status  03/31/2023 3.350 0.450 - 4.500 uIU/mL Final         Passed - Valid encounter within last 12 months    Recent Outpatient Visits           2 weeks ago Type 2 diabetes mellitus without complication, without long-term current use of insulin (HCC)   [place] [facility], [facility]   5 months ago Primary hypertension   [place] [facility], MD   9 months ago Type 2 diabetes mellitus without complication, without long-term current use of insulin (HCC)   [place] [facility], MD   1 year ago Type 2 diabetes mellitus without complication, without long-term current use of insulin (HCC)   [place] [facility], MD   1 year ago RUQ pain   [place] [facility], MD       Future Appointments             In 3 months Duanne Limerick, MD [facility] at [facility]

## 2023-05-08 DIAGNOSIS — H9012 Conductive hearing loss, unilateral, left ear, with unrestricted hearing on the contralateral side: Secondary | ICD-10-CM | POA: Diagnosis not present

## 2023-05-08 DIAGNOSIS — H6122 Impacted cerumen, left ear: Secondary | ICD-10-CM | POA: Diagnosis not present

## 2023-05-12 ENCOUNTER — Encounter: Payer: Self-pay | Admitting: Family Medicine

## 2023-05-12 ENCOUNTER — Ambulatory Visit (INDEPENDENT_AMBULATORY_CARE_PROVIDER_SITE_OTHER): Payer: Medicare Other | Admitting: Family Medicine

## 2023-05-12 ENCOUNTER — Ambulatory Visit: Payer: Self-pay | Admitting: *Deleted

## 2023-05-12 VITALS — BP 150/86 | HR 100 | Ht 66.0 in | Wt 150.0 lb

## 2023-05-12 DIAGNOSIS — K580 Irritable bowel syndrome with diarrhea: Secondary | ICD-10-CM

## 2023-05-12 DIAGNOSIS — Z0000 Encounter for general adult medical examination without abnormal findings: Secondary | ICD-10-CM

## 2023-05-12 DIAGNOSIS — R300 Dysuria: Secondary | ICD-10-CM

## 2023-05-12 DIAGNOSIS — N342 Other urethritis: Secondary | ICD-10-CM

## 2023-05-12 LAB — POCT URINALYSIS DIPSTICK
Bilirubin, UA: NEGATIVE
Glucose, UA: NEGATIVE
Ketones, UA: NEGATIVE
Nitrite, UA: NEGATIVE
Protein, UA: NEGATIVE
Spec Grav, UA: 1.02 (ref 1.010–1.025)
Urobilinogen, UA: 0.2 E.U./dL
pH, UA: 6 (ref 5.0–8.0)

## 2023-05-12 MED ORDER — CHOLESTYRAMINE LIGHT 4 GM/DOSE PO POWD: ORAL | 1 refills | Status: DC

## 2023-05-12 MED ORDER — FLUCONAZOLE 150 MG PO TABS
150.0000 mg | ORAL_TABLET | Freq: Once | ORAL | 0 refills | Status: AC
Start: 2023-05-12 — End: 2023-05-12

## 2023-05-12 MED ORDER — NITROFURANTOIN MONOHYD MACRO 100 MG PO CAPS
100.0000 mg | ORAL_CAPSULE | Freq: Two times a day (BID) | ORAL | 0 refills | Status: AC
Start: 2023-05-12 — End: 2023-05-19

## 2023-05-12 NOTE — Progress Notes (Signed)
Subjective:   [place] E Maj is a 77 y.o. female who presents for Medicare Annual (Subsequent) preventive examination.  Review of Systems    Pt was in the office        Objective:    Today's Vitals   05/12/23 1038  BP: (!) 150/86  Pulse: 100  Weight: 150 lb (68 kg)  Height: 5\' 6"  (1.676 m)   Body mass index is 24.21 kg/m.     05/12/2023   10:40 AM 10/26/2020    6:31 AM 01/15/2016    9:20 AM  Advanced Directives  Does Patient Have a Medical Advance Directive? Yes Yes Yes  Type of Advance Directive Living will Healthcare Power of [place];Living will Healthcare Power of [place];Living will  Does patient want to make changes to medical advance directive?  No - Patient declined   Copy of Healthcare Power of Attorney in Chart?  No - copy requested No - copy requested    Current Medications (verified) Outpatient Encounter Medications as of 05/12/2023  Medication Sig   cholestyramine light (PREVALITE) 4 GM/DOSE powder TAKE 1 TEAPOONFUL BY MOUTH EVERY DAY AS DIRECTED   ezetimibe (ZETIA) 10 MG tablet TAKE 1 TABLET BY MOUTH EVERY DAY   fluconazole (DIFLUCAN) 150 MG tablet Take 1 tablet (150 mg total) by mouth once for 1 dose.   glipiZIDE (GLUCOTROL) 5 MG tablet Take 1 tablet (5 mg total) by mouth daily before breakfast.   hyoscyamine (LEVSIN SL) 0.125 MG SL tablet Place 1 tablet (0.125 mg total) under the tongue every 4 (four) hours as needed. (Patient not taking: Reported on 03/31/2023)   levothyroxine (SYNTHROID) 25 MCG tablet Take 1 tablet (25 mcg total) by mouth daily.   losartan-hydrochlorothiazide (HYZAAR) 50-12.5 MG tablet Take 1 tablet by mouth daily.   metFORMIN (GLUCOPHAGE-XR) 750 MG 24 hr tablet TAKE 1 TABLET BY MOUTH EVERY DAY WITH BREAKFAST   mupirocin ointment (BACTROBAN) 2 % Apply 1 Application topically 2 (two) times daily.   nitrofurantoin, macrocrystal-monohydrate, (MACROBID) 100 MG capsule Take 1 capsule (100 mg total) by mouth 2 (two) times daily for 7 days.    pantoprazole (PROTONIX) 40 MG tablet TAKE 1 TABLET BY MOUTH EVERY DAY   triamcinolone (NASACORT) 55 MCG/ACT AERO nasal inhaler Place 2 sprays into the nose daily.   No facility-administered encounter medications on file as of 05/12/2023.    Allergies (verified) Azithromycin, Hydrocodone-acetaminophen, and Penicillins   History: Past Medical History:  Diagnosis Date   Diabetes mellitus without complication (HCC)    GERD (gastroesophageal reflux disease)    Hypothyroidism    Irritable bowel syndrome    PONV (postoperative nausea and vomiting)    Thyroid disease    Past Surgical History:  Procedure Laterality Date   BREAST BIOPSY Right 2004   neg   CHOLECYSTECTOMY     COLONOSCOPY  2015   repeat 10 yrs- Dr Servando Snare   MENISCUS REPAIR Bilateral    Right x2, Left x1   Family History  Problem Relation Age of Onset   Cancer Mother    Diabetes Mother    Cancer Father    Heart disease Maternal Grandmother    Heart disease Paternal Grandmother    Breast cancer Neg Hx    Social History   Socioeconomic History   Marital status: Married    Spouse name: Not on file   Number of children: Not on file   Years of education: Not on file   Highest education level: Not on file  Occupational History  Not on file  Tobacco Use   Smoking status: Never   Smokeless tobacco: Never  Vaping Use   Vaping Use: Never used  Substance and Sexual Activity   Alcohol use: Yes    Alcohol/week: 3.0 standard drinks of alcohol    Types: 3 Glasses of wine per week   Drug use: No   Sexual activity: Yes  Other Topics Concern   Not on file  Social History Narrative   Not on file   Social Determinants of Health   Financial Resource Strain: Low Risk  (05/12/2023)   Overall Financial Resource Strain (CARDIA)    Difficulty of Paying Living Expenses: Not hard at all  Food Insecurity: No Food Insecurity (05/12/2023)   Hunger Vital Sign    Worried About Running Out of Food in the Last Year: Never true     Ran Out of Food in the Last Year: Never true  Transportation Needs: No Transportation Needs (05/12/2023)   PRAPARE - Administrator, Civil Service (Medical): No    Lack of Transportation (Non-Medical): No  Physical Activity: Not on file  Stress: Not on file  Social Connections: Not on file    Tobacco Counseling Counseling given: Not Answered   Clinical Intake:  Pre-visit preparation completed: No  Pain : No/denies pain     Diabetes: Yes CBG done?: No Did pt. bring in CBG monitor from home?: No     Diabetic? Yes  Interpreter Needed?: No      Activities of Daily Living    05/12/2023   10:40 AM  In your present state of health, do you have any difficulty performing the following activities:  Hearing? 0  Vision? 0  Difficulty concentrating or making decisions? 0  Walking or climbing stairs? 0  Dressing or bathing? 0  Doing errands, shopping? 0  Preparing Food and eating ? N  Using the Toilet? N  In the past six months, have you accidently leaked urine? N  Do you have problems with loss of bowel control? N  Managing your Medications? N  Managing your Finances? N  Housekeeping or managing your Housekeeping? N    Patient Care Team: Duanne Limerick, MD as PCP - General (Family Medicine)  Indicate any recent Medical Services you may have received from other than [facility] providers in the past year (date may be approximate).     Assessment:   This is a routine wellness examination for [place].  Hearing/Vision screen Hearing Screening - Comments:: No hearing problems  Dietary issues and exercise activities discussed: Current Exercise Habits: Home exercise routine, Type of exercise: walking;Other - see comments (yard work), Time (Minutes): 20, Frequency (Times/Week): 3, Weekly Exercise (Minutes/Week): 60, Intensity: Mild   Goals Addressed   None   Depression Screen    05/12/2023   10:12 AM 03/31/2023    9:55 AM 10/27/2022   11:27 AM 07/24/2022     8:22 AM 03/18/2022    9:33 AM 01/17/2022    9:40 AM 11/05/2021   11:52 AM  PHQ 2/9 Scores  PHQ - 2 Score 0 0 0 0 1 0 0  PHQ- 9 Score 0 3 0 3 6 2 2     Fall Risk    05/12/2023   10:12 AM 03/31/2023    9:54 AM 10/27/2022   11:26 AM 07/24/2022    8:21 AM 01/17/2022    9:40 AM  Fall Risk   Falls in the past year? 0 0 0 1 0  Number falls  in past yr: 0 0 0 1 0  Injury with Fall? 0 0 0 0 0  Risk for fall due to : No Fall Risks No Fall Risks No Fall Risks No Fall Risks No Fall Risks  Follow up Falls evaluation completed Falls evaluation completed;Falls prevention discussed Falls evaluation completed Falls evaluation completed Falls evaluation completed    FALL RISK PREVENTION PERTAINING TO THE HOME:  Any stairs in or around the home? Yes  If so, are there any without handrails? No  Home free of loose throw rugs in walkways, pet beds, electrical cords, etc? Yes  Adequate lighting in your home to reduce risk of falls? Yes   ASSISTIVE DEVICES UTILIZED TO PREVENT FALLS:  Life alert? No  Use of a cane, walker or w/c? No  Grab bars in the bathroom? Yes  Shower chair or bench in shower? No  Elevated toilet seat or a handicapped toilet? No   TIMED UP AND GO:  Was the test performed? No .  Length of time to ambulate 10 feet: n/a sec.   Gait steady and fast without use of assistive device  Cognitive Function:        05/12/2023   10:41 AM  6CIT Screen  What Year? 0 points  What month? 0 points  What time? 0 points  Count back from 20 0 points  Months in reverse 0 points  Repeat phrase 2 points  Total Score 2 points    Immunizations Immunization History  Administered Date(s) Administered   Fluad Quad(high Dose 65+) 10/31/2021   Influenza, High Dose Seasonal PF 09/04/2017, 10/25/2018, 09/27/2019, 10/15/2020   Influenza,inj,Quad PF,6+ Mos 08/28/2016   Influenza-Unspecified 02/05/1946, 09/15/2019   Moderna Sars-Covid-2 Vaccination 12/03/2020   PFIZER(Purple Top)SARS-COV-2  Vaccination 01/24/2020, 02/14/2020   Pneumococcal Conjugate-13 01/15/2016   Pneumococcal Polysaccharide-23 03/02/2017    TDAP status: Due, Education has been provided regarding the importance of this vaccine. Advised may receive this vaccine at local pharmacy or Health Dept. Aware to provide a copy of the vaccination record if obtained from local pharmacy or Health Dept. Verbalized acceptance and understanding.  Flu Vaccine status: Due, Education has been provided regarding the importance of this vaccine. Advised may receive this vaccine at local pharmacy or Health Dept. Aware to provide a copy of the vaccination record if obtained from local pharmacy or Health Dept. Verbalized acceptance and understanding.  Pneumococcal vaccine status: Up to date  Covid-19 vaccine status: Completed vaccines  Qualifies for Shingles Vaccine? Yes   Zostavax completed No   Shingrix Completed?: No.    Education has been provided regarding the importance of this vaccine. Patient has been advised to call insurance company to determine out of pocket expense if they have not yet received this vaccine. Advised may also receive vaccine at local pharmacy or Health Dept. Verbalized acceptance and understanding.  Screening Tests Health Maintenance  Topic Date Due   COVID-19 Vaccine (4 - 2023-24 season) 05/28/2023 (Originally 08/15/2022)   Zoster Vaccines- Shingrix (1 of 2) 06/30/2023 (Originally 06/12/1965)   OPHTHALMOLOGY EXAM  11/12/2023 (Originally 01/14/2023)   INFLUENZA VACCINE  07/16/2023   HEMOGLOBIN A1C  09/30/2023   Diabetic kidney evaluation - Urine ACR  10/29/2023   Diabetic kidney evaluation - eGFR measurement  03/30/2024   FOOT EXAM  03/30/2024   Medicare Annual Wellness (AWV)  05/11/2024   Pneumonia Vaccine 17+ Years old  Completed   DEXA SCAN  Completed   Hepatitis C Screening  Completed   HPV VACCINES  Aged Out  DTaP/Tdap/Td  Discontinued   Colonoscopy  Discontinued    Health Maintenance  There  are no preventive care reminders to display for this patient.   Colorectal cancer screening: Type of screening: Colonoscopy. Completed 04/25/2014. Repeat every 10 years    Bone Density status: Completed 01/02/2015. Results reflect: Bone density results: OSTEOPOROSIS. Repeat every 2-3 years.  Lung Cancer Screening: (Low Dose CT Chest recommended if Age 43-80 years, 30 pack-year currently smoking OR have quit w/in 15years.) does not qualify.   Lung Cancer Screening Referral: n/a  Additional Screening:  Hepatitis C Screening: does qualify; Completed 01/07/2018  Vision Screening: Recommended annual ophthalmology exams for early detection of glaucoma and other disorders of the eye. Is the patient up to date with their annual eye exam?  No  Who is the provider or what is the name of the office in which the patient attends annual eye exams? [facility] If pt is not established with a provider, would they like to be referred to a provider to establish care? No .   Dental Screening: Recommended annual dental exams for proper oral hygiene  Community Resource Referral / Chronic Care Management: CRR required this visit?  No   CCM required this visit?  No      Plan:     I have personally reviewed and noted the following in the patient's chart:   Medical and social history Use of alcohol, tobacco or illicit drugs  Current medications and supplements including opioid prescriptions. Patient is not currently taking opioid prescriptions. Functional ability and status Nutritional status Physical activity Advanced directives List of other physicians Hospitalizations, surgeries, and ER visits in previous 12 months Vitals Screenings to include cognitive, depression, and falls Referrals and appointments  In addition, I have reviewed and discussed with patient certain preventive protocols, quality metrics, and best practice recommendations. A written personalized care plan for preventive  services as well as general preventive health recommendations were provided to patient.     Eulis Canner Oreoluwa Aigner, CMA   05/12/2023   Nurse Notes: none

## 2023-05-12 NOTE — Progress Notes (Signed)
Date:  05/12/2023   Name:  Rebecca Robbins   DOB:  1946/07/09   MRN:  [id number]   Chief Complaint: Urinary Tract Infection  Urinary Tract Infection  This is a new problem. The current episode started in the past 7 days. The problem occurs every urination. The problem has been gradually worsening. The quality of the pain is described as burning. The pain is mild. There has been no fever. Associated symptoms include frequency and urgency. Pertinent negatives include no chills, flank pain or hematuria. Associated symptoms comments: Urinary incontinence. She has tried increased fluids (cranberry) for the symptoms. The treatment provided mild relief.    Lab Results  Component Value Date   NA 142 03/31/2023   K 4.6 03/31/2023   CO2 20 03/31/2023   GLUCOSE 138 (H) 03/31/2023   BUN 8 03/31/2023   CREATININE 0.70 03/31/2023   CALCIUM 10.7 (H) 03/31/2023   EGFR 89 03/31/2023   GFRNONAA 89 12/18/2020   Lab Results  Component Value Date   CHOL 204 (H) 03/31/2023   HDL 76 03/31/2023   LDLCALC 109 (H) 03/31/2023   TRIG 106 03/31/2023   CHOLHDL 3.1 08/23/2018   Lab Results  Component Value Date   TSH 3.350 03/31/2023   Lab Results  Component Value Date   HGBA1C 6.9 (H) 03/31/2023   No results found for: "WBC", "HGB", "HCT", "MCV", "PLT" Lab Results  Component Value Date   ALT 16 03/31/2023   AST 21 03/31/2023   GGT 29 05/21/2022   ALKPHOS 87 03/31/2023   BILITOT 0.6 03/31/2023   No results found for: "25OHVITD2", "25OHVITD3", "VD25OH"   Review of Systems  Constitutional:  Negative for chills and fever.  Respiratory:  Negative for chest tightness, shortness of breath and wheezing.   Cardiovascular:  Negative for chest pain and palpitations.  Gastrointestinal:  Negative for abdominal distention, blood in stool, constipation and diarrhea.  Genitourinary:  Positive for dysuria, frequency and urgency. Negative for flank pain, hematuria, pelvic pain and vaginal discharge.     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    Allergies  Allergen Reactions   Azithromycin Rash   Hydrocodone-Acetaminophen Nausea Only and Rash   Penicillins Other (See Comments)    Does not work for patient    Past Surgical History:  Procedure Laterality Date   BREAST BIOPSY Right 2004   neg   CHOLECYSTECTOMY     COLONOSCOPY  2015   repeat 10 yrs- Dr Servando Snare   MENISCUS REPAIR Bilateral    Right x2, Left x1    Social History   Tobacco Use   Smoking status: Never   Smokeless tobacco: Never  Vaping Use   Vaping Use: Never used  Substance Use Topics   Alcohol use: Yes    Alcohol/week: 3.0 standard drinks of alcohol    Types: 3 Glasses of wine per week   Drug use: No     Medication list has been reviewed and updated.  Current Meds  Medication Sig   cholestyramine light (PREVALITE)  4 GM/DOSE powder TAKE 1 TEAPOONFUL BY MOUTH EVERY DAY AS DIRECTED   ezetimibe (ZETIA) 10 MG tablet TAKE 1 TABLET BY MOUTH EVERY DAY   glipiZIDE (GLUCOTROL) 5 MG tablet Take 1 tablet (5 mg total) by mouth daily before breakfast.   levothyroxine (SYNTHROID) 25 MCG tablet Take 1 tablet (25 mcg total) by mouth daily.   losartan-hydrochlorothiazide (HYZAAR) 50-12.5 MG tablet Take 1 tablet by mouth daily.   metFORMIN (GLUCOPHAGE-XR) 750 MG 24 hr tablet TAKE 1 TABLET BY MOUTH EVERY DAY WITH BREAKFAST   mupirocin ointment (BACTROBAN) 2 % Apply 1 Application topically 2 (two) times  daily.   pantoprazole (PROTONIX) 40 MG tablet TAKE 1 TABLET BY MOUTH EVERY DAY   triamcinolone (NASACORT) 55 MCG/ACT AERO nasal inhaler Place 2 sprays into the nose daily.       05/12/2023   10:13 AM 03/31/2023    9:55 AM 10/27/2022   11:27 AM 07/24/2022    8:23 AM  GAD 7 : Generalized Anxiety Score  Nervous, Anxious, on Edge 0 0 0 1  Control/stop worrying 0 0 0 1  Worry too much - different things 0 0 0 1  Trouble relaxing 0 0 0 1  Restless 0 0 0 0  Easily annoyed or irritable 0 0 0 1  Afraid - awful might happen 0 0 0 0  Total GAD 7 Score 0 0 0 5  Anxiety Difficulty Not difficult at all Not difficult at all Not difficult at all Not difficult at all       05/12/2023   10:12 AM 03/31/2023    9:55 AM 10/27/2022   11:27 AM  Depression screen PHQ 2/9  Decreased Interest 0 0 0  Down, Depressed, Hopeless 0 0 0  PHQ - 2 Score 0 0 0  Altered sleeping 0 2 0  Tired, decreased energy 0 1 0  Change in appetite 0 0 0  Feeling bad or failure about yourself  0 0 0  Trouble concentrating 0 0 0  Moving slowly or fidgety/restless 0 0 0  Suicidal thoughts 0 0 0  PHQ-9 Score 0 3 0  Difficult doing work/chores Not difficult at all Somewhat difficult Not difficult at all    BP Readings from Last 3 Encounters:  05/12/23 (!) 150/86  03/31/23 128/78  10/27/22 (!) 162/88    Physical Exam Vitals and nursing note reviewed. Exam conducted with a chaperone present.  Constitutional:      General: She is not in acute distress.    Appearance: She is not diaphoretic.  HENT:     Head: Normocephalic and atraumatic.     Right Ear: Tympanic membrane and external ear normal.     Left Ear: Tympanic membrane and external ear normal.     Nose: Nose normal.     Mouth/Throat:     Mouth: Mucous membranes are moist.     Pharynx: No oropharyngeal exudate or posterior oropharyngeal erythema.  Eyes:     General:        Right eye: No discharge.        Left eye: No discharge.     Conjunctiva/sclera:  Conjunctivae normal.     Pupils: Pupils are equal, round, and reactive to light.  Neck:     Thyroid: No thyromegaly.     Vascular: No JVD.  Cardiovascular:     Rate and Rhythm: Normal rate and regular rhythm.     Heart sounds: Normal heart sounds. No murmur heard.    No friction  rub. No gallop.  Pulmonary:     Effort: Pulmonary effort is normal.     Breath sounds: Normal breath sounds. No wheezing, rhonchi or rales.  Abdominal:     General: Bowel sounds are normal.     Palpations: Abdomen is soft. There is no mass.     Tenderness: There is no abdominal tenderness. There is no guarding.  Musculoskeletal:        General: Normal range of motion.     Cervical back: Normal range of motion and neck supple.  Lymphadenopathy:     Cervical: No cervical adenopathy.  Skin:    General: Skin is warm and dry.     Findings: No bruising or erythema.  Neurological:     Mental Status: She is alert.     Wt Readings from Last 3 Encounters:  05/12/23 150 lb (68 kg)  03/31/23 150 lb (68 kg)  10/27/22 152 lb (68.9 kg)    BP (!) 150/86 (BP Location: Left Arm, Cuff Size: Large)   Pulse 100   Ht 5\' 6"  (1.676 m)   Wt 150 lb (68 kg)   SpO2 97%   BMI 24.21 kg/m   Assessment and Plan:  1. Dysuria New onset.  Persistent.  Stable.  Patient [place] from the beach on [place] with dysuria urgency frequency without discharge.  There is no suprapubic tenderness or CVA tenderness this is consistent with urethral-itis and patient is having some urinary incontinence.  We will treat with a 5-day course of Macrobid twice a day. - POCT urinalysis dipstick - Urine Culture - nitrofurantoin, macrocrystal-monohydrate, (MACROBID) 100 MG capsule; Take 1 capsule (100 mg total) by mouth 2 (two) times daily for 5 days.  Dispense: 10 capsule; Refill: 0 - fluconazole (DIFLUCAN) 150 MG tablet; Take 1 tablet (150 mg total) by mouth once for 1 dose.  Dispense: 1 tablet; Refill: 0  2. Urethritis As noted above. -  nitrofurantoin, macrocrystal-monohydrate, (MACROBID) 100 MG capsule; Take 1 capsule (100 mg total) by mouth 2 (two) times daily for 7 days.  Dispense: 10 capsule; Refill: 0 - fluconazole (DIFLUCAN) 150 MG tablet; Take 1 tablet (150 mg total) by mouth once for 1 dose.  Dispense: 1 tablet; Refill: 0  3. Irritable bowel syndrome with diarrhea This was not refilled on her recent medication refills on the this is refilled at this time. - cholestyramine light (PREVALITE) 4 GM/DOSE powder; TAKE 1 TEAPOONFUL BY MOUTH EVERY DAY AS DIRECTED  Dispense: 231 g; Refill: 1      Elizabeth Sauer, MD

## 2023-05-12 NOTE — Telephone Encounter (Signed)
  Chief Complaint: pain with urination- using AZO Symptoms: abdominal pian, pain with urination, frequency, urgency Frequency: started Thursday last week Pertinent Negatives: Patient denies fever Disposition: [] ED /[] Urgent Care (no appt availability in office) / [x] Appointment(In office/virtual)/ []  [place] [facility]/ [] Home Care/ [] Refused Recommended Disposition /[] [place] [facility]/ []  Follow-up with PCP Additional Notes: Patient has been scheduled for evaluation- advised home care until appointment.   Reason for Disposition  Age > 50 years  Answer Assessment - Initial Assessment Questions 1. SEVERITY: "How bad is the pain?"  (e.g., Scale 1-10; mild, moderate, or severe)   - MILD (1-3): complains slightly about urination hurting   - MODERATE (4-7): interferes with normal activities     - SEVERE (8-10): excruciating, unwilling or unable to urinate because of the pain      Using AZO 2. FREQUENCY: "How many times have you had painful urination today?"      Medication is helping 3. PATTERN: "Is pain present every time you urinate or just sometimes?"      Yes- frequency 4. ONSET: "When did the painful urination start?"     Thursday night 5. FEVER: "Do you have a fever?" If Yes, ask: "What is your temperature, how was it measured, and when did it start?"     no 6. PAST UTI: "Have you had a urine infection before?" If Yes, ask: "When was the last time?" and "What happened that time?"      yes 7. CAUSE: "What do you think is causing the painful urination?"  (e.g., UTI, scratch, Herpes sore)     UTI 8. OTHER SYMPTOMS: "Do you have any other symptoms?" (e.g., blood in urine, flank pain, genital sores, urgency, vaginal discharge)     urgency  Protocols used: Urination Pain - Female-A-AH

## 2023-05-15 LAB — URINE CULTURE

## 2023-06-22 DIAGNOSIS — H52223 Regular astigmatism, bilateral: Secondary | ICD-10-CM | POA: Diagnosis not present

## 2023-06-22 DIAGNOSIS — H5213 Myopia, bilateral: Secondary | ICD-10-CM | POA: Diagnosis not present

## 2023-06-22 DIAGNOSIS — H04123 Dry eye syndrome of bilateral lacrimal glands: Secondary | ICD-10-CM | POA: Diagnosis not present

## 2023-06-22 DIAGNOSIS — H2513 Age-related nuclear cataract, bilateral: Secondary | ICD-10-CM | POA: Diagnosis not present

## 2023-06-22 DIAGNOSIS — E119 Type 2 diabetes mellitus without complications: Secondary | ICD-10-CM | POA: Diagnosis not present

## 2023-06-22 LAB — HM DIABETES EYE EXAM

## 2023-07-24 ENCOUNTER — Other Ambulatory Visit: Payer: Self-pay | Admitting: Family Medicine

## 2023-07-24 DIAGNOSIS — K760 Fatty (change of) liver, not elsewhere classified: Secondary | ICD-10-CM

## 2023-07-24 DIAGNOSIS — E7801 Familial hypercholesterolemia: Secondary | ICD-10-CM

## 2023-07-24 NOTE — Telephone Encounter (Signed)
Requested Prescriptions  Pending Prescriptions Disp Refills   ezetimibe (ZETIA) 10 MG tablet [Pharmacy Med Name: EZETIMIBE 10 MG TABLET] 90 tablet 0    Sig: TAKE 1 TABLET BY MOUTH EVERY DAY     Cardiovascular:  Antilipid - Sterol Transport Inhibitors Failed - 07/24/2023  2:27 AM      Failed - Lipid Panel in normal range within the last 12 months    Cholesterol, Total  Date Value Ref Range Status  03/31/2023 204 (H) 100 - 199 mg/dL Final   LDL Chol Calc (NIH)  Date Value Ref Range Status  03/31/2023 109 (H) 0 - 99 mg/dL Final   HDL  Date Value Ref Range Status  03/31/2023 76 >39 mg/dL Final   Triglycerides  Date Value Ref Range Status  03/31/2023 106 0 - 149 mg/dL Final         Passed - AST in normal range and within 360 days    AST  Date Value Ref Range Status  03/31/2023 21 0 - 40 IU/L Final         Passed - ALT in normal range and within 360 days    ALT  Date Value Ref Range Status  03/31/2023 16 0 - 32 IU/L Final         Passed - Patient is not pregnant      Passed - Valid encounter within last 12 months    Recent Outpatient Visits           2 months ago Encounter for [employer] annual wellness exam   [facility], [facility]   2 months ago Dysuria   [facility], MD   3 months ago Type 2 diabetes mellitus without complication, without long-term current use of insulin (HCC)   [place] [facility], MD   9 months ago Primary hypertension   [place] [facility], MD   1 year ago Type 2 diabetes mellitus without complication, without long-term current use of insulin (HCC)   [place] [facility], MD       Future Appointments             In 2 weeks Duanne Limerick, MD [facility]  [facility] at [facility], [facility]

## 2023-08-07 ENCOUNTER — Ambulatory Visit: Payer: Medicare Other | Admitting: Family Medicine

## 2023-08-11 IMAGING — US US ABDOMEN LIMITED
1 series · 14 of 23 positions shown · non-contrast
Comparison: None.

CLINICAL DATA: Right upper quadrant pain. Liver steatosis.
Cholecystectomy.

EXAM:
ULTRASOUND ABDOMEN LIMITED RIGHT UPPER QUADRANT

[Series 1: us abdomen limited · 0.23mm/px · 14 of 23 slices shown]
[im 1/23]
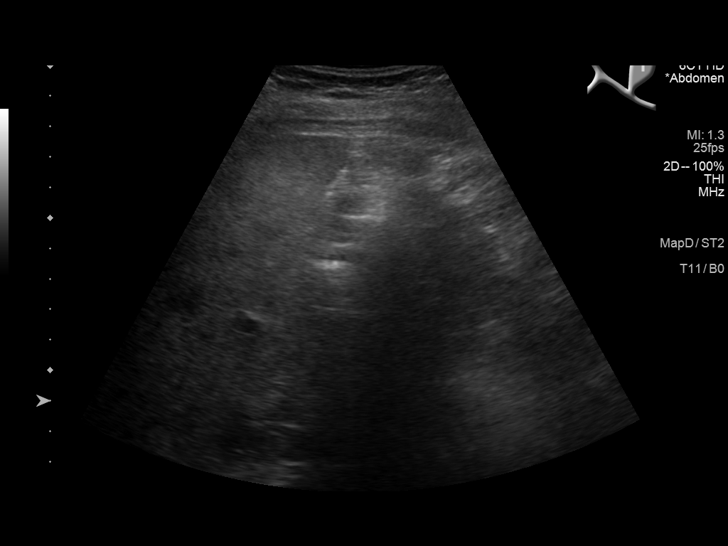
[im 3/23]
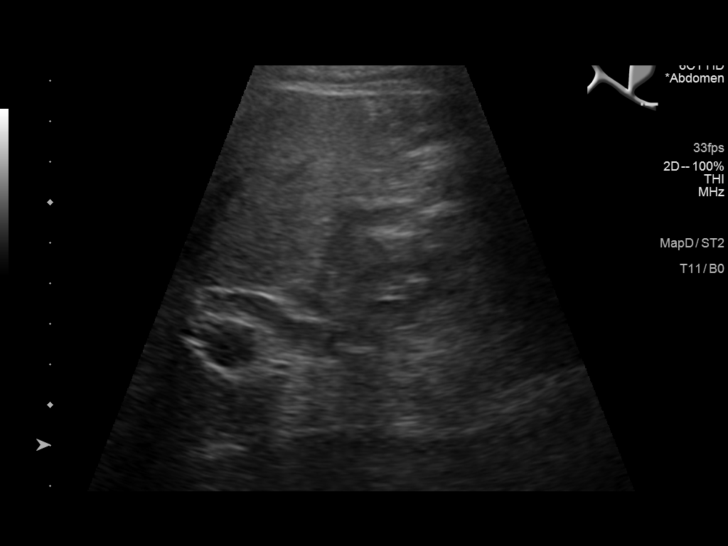
[im 5/23]
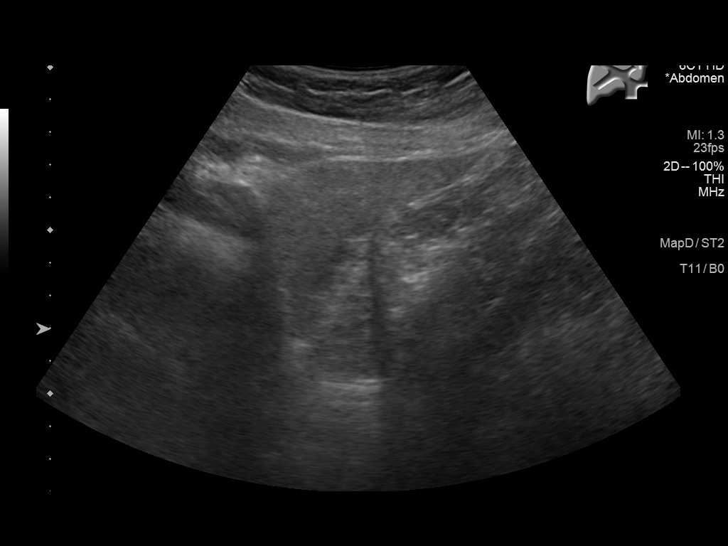
[im 6/23]
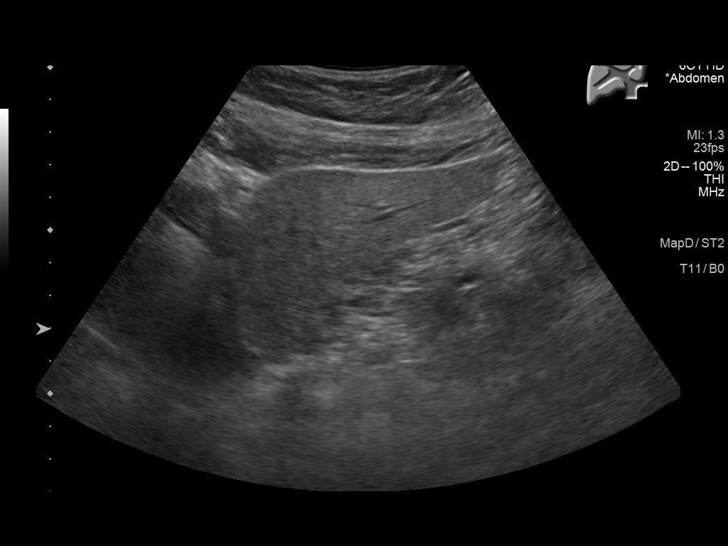
[im 8/23]
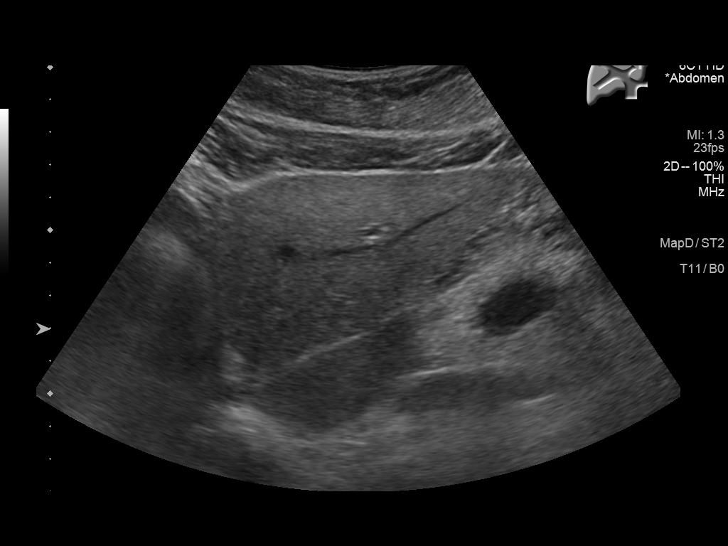
[im 10/23]
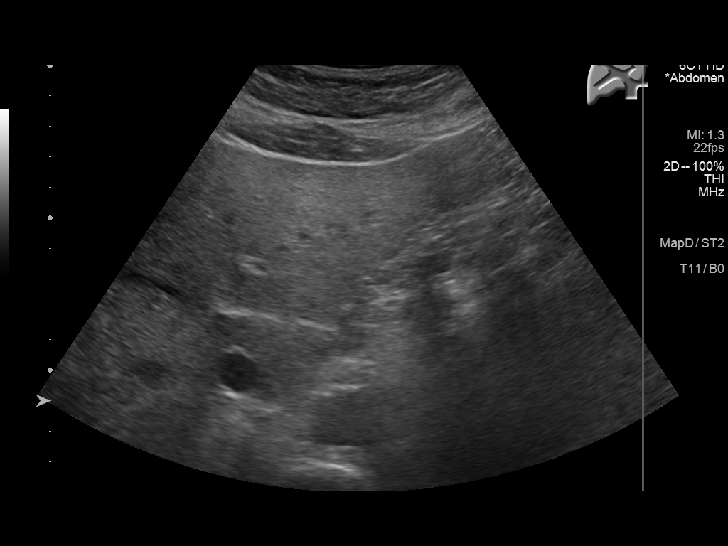
[im 11/23]
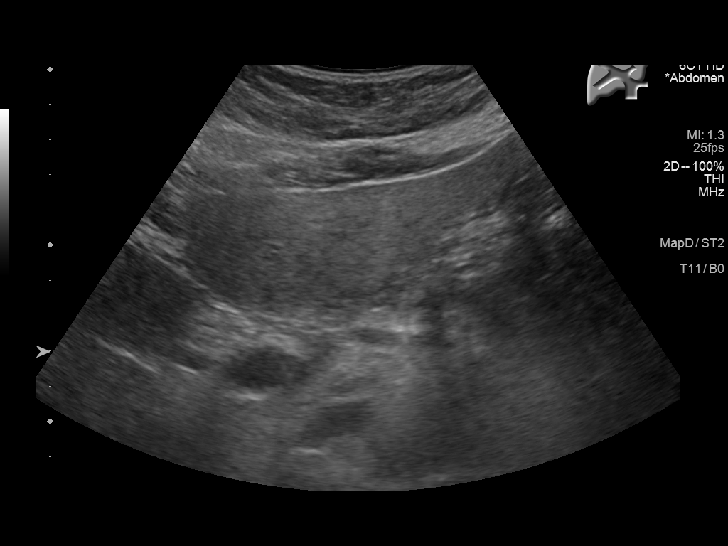
[im 13/23]
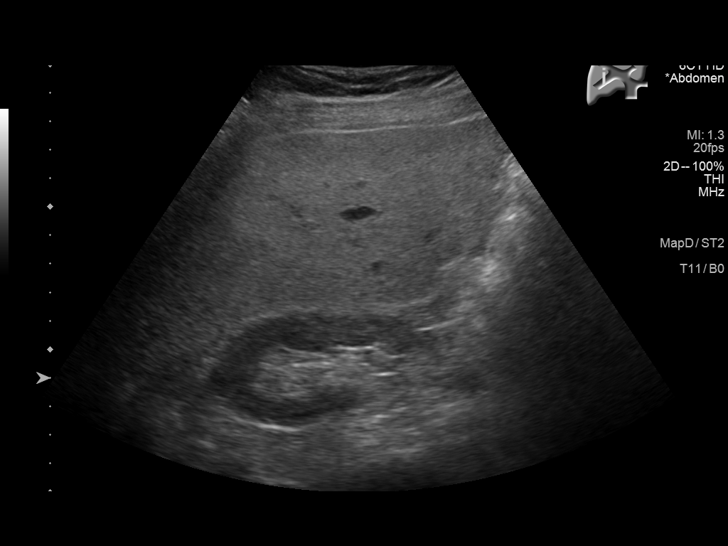
[im 14/23]
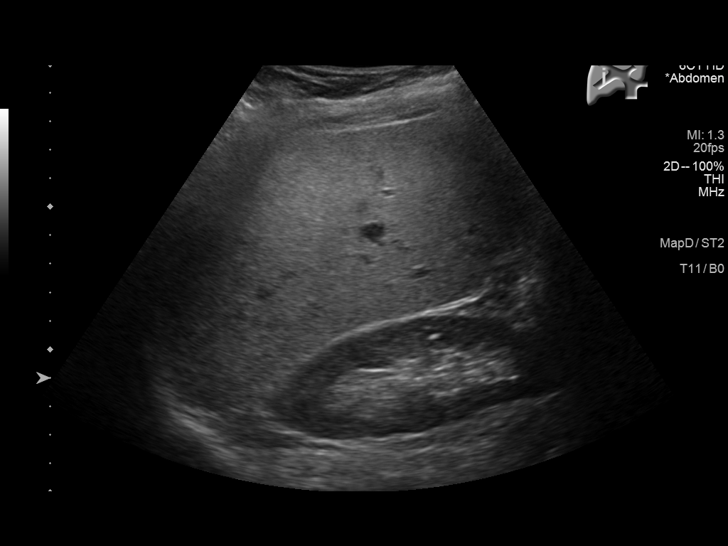
[im 16/23]
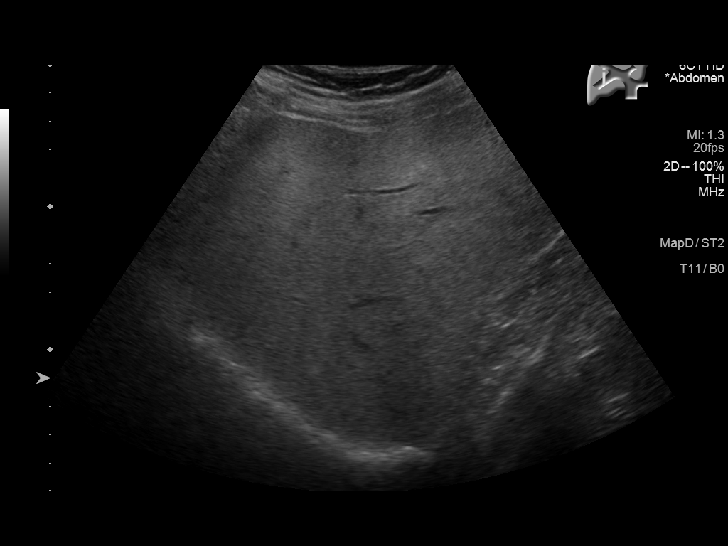
[im 18/23]
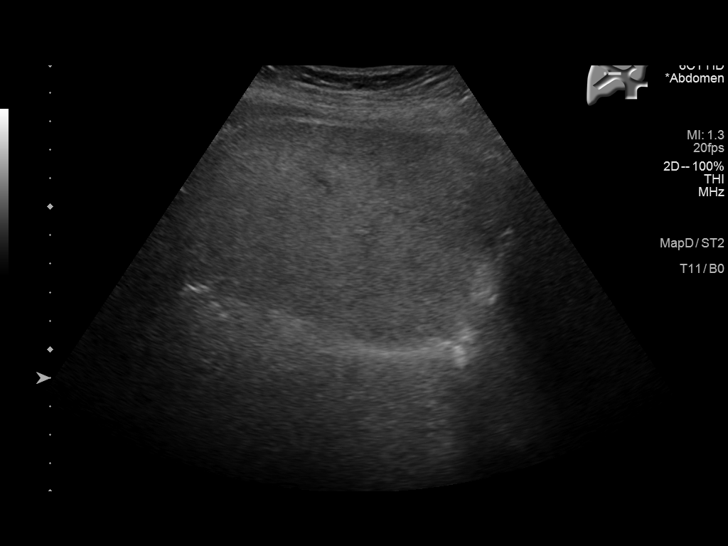
[im 19/23]
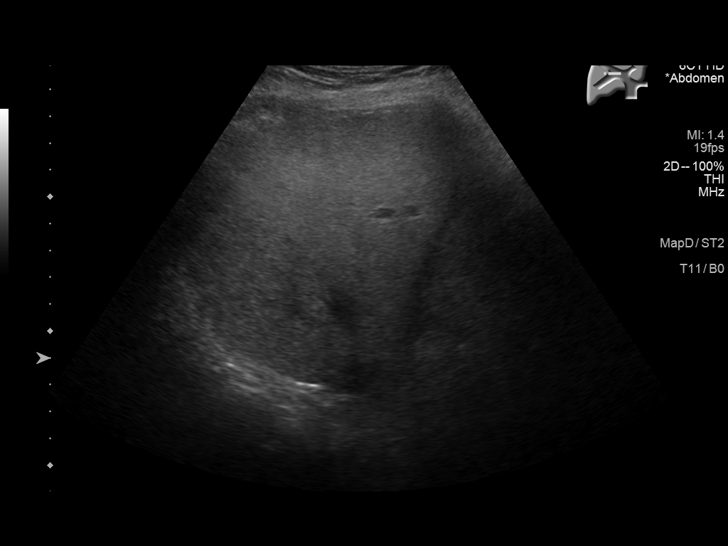
[im 21/23]
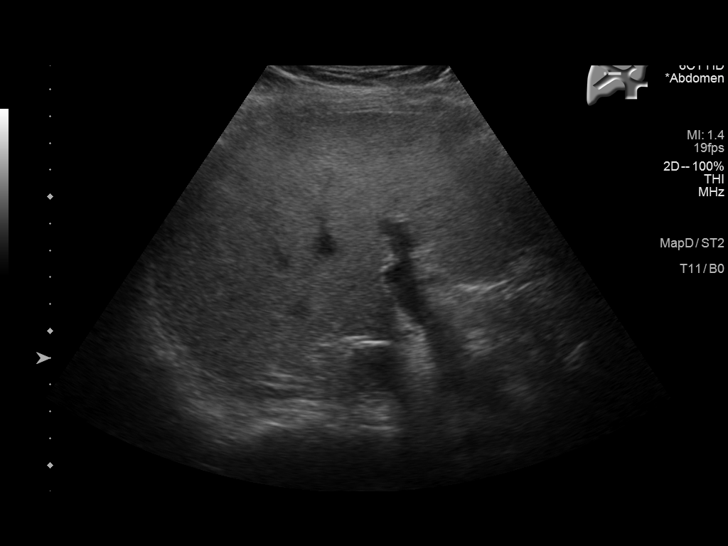
[im 23/23]
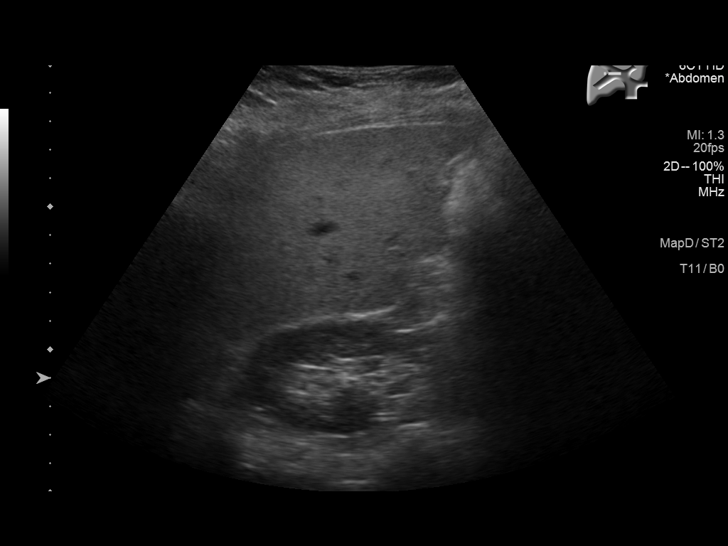

[14 of 23 positions shown; findings below may reference images not displayed]

FINDINGS: Gallbladder:

Surgically absent

Common bile duct:

Diameter: 6.7 mm

Liver:

Marked increased echogenicity throughout the liver. No focal mass.
Portal vein is patent on color Doppler imaging with normal direction
of blood flow towards the liver.

Other: None.
IMPRESSION: 1. The gallbladder is surgically absent.
2. Prominence of the common bile duct is within normal limits given
previous cholecystectomy.
3. Increased echogenicity throughout the liver consistent with
reported hepatic steatosis.

## 2023-08-12 ENCOUNTER — Ambulatory Visit: Payer: Self-pay

## 2023-08-12 NOTE — Telephone Encounter (Signed)
Pt believes she has a bladder infection, says her symptoms began this morning. Pt wants to know if anything can be called in prior to her appt for this tomorrow.   Best contact: [phone]    Chief Complaint: Urinary frequency, burning. Has appointment tomorrow. Symptoms: Above. Asking for antibiotics prior to this appointment. Frequency: Today Pertinent Negatives: Patient denies  Disposition: [] ED /[] Urgent Care (no appt availability in office) / [x] Appointment(In office/virtual)/ []  [place] [facility]/ [] Home Care/ [] Refused Recommended Disposition /[] [place] [facility]/ []  Follow-up with PCP Additional Notes: Pt. Understands she needs to be seen before medication can be called in.  Reason for Disposition  Urinating more frequently than usual (i.e., frequency)  Answer Assessment - Initial Assessment Questions 1. SYMPTOM: "What's the main symptom you're concerned about?" (e.g., frequency, incontinence)     Frequency, burning 2. ONSET: "When did the    start?"     Today 3. PAIN: "Is there any pain?" If Yes, ask: "How bad is it?" (Scale: 1-10; mild, moderate, severe)     Mild 4. CAUSE: "What do you think is causing the symptoms?"     UTI 5. OTHER SYMPTOMS: "Do you have any other symptoms?" (e.g., blood in urine, fever, flank pain, pain with urination)     No 6. PREGNANCY: "Is there any chance you are pregnant?" "When was your last menstrual period?"     No  Protocols used: Urinary Symptoms-A-AH

## 2023-08-13 ENCOUNTER — Encounter: Payer: Self-pay | Admitting: Family Medicine

## 2023-08-13 ENCOUNTER — Ambulatory Visit (INDEPENDENT_AMBULATORY_CARE_PROVIDER_SITE_OTHER): Payer: Medicare Other | Admitting: Family Medicine

## 2023-08-13 VITALS — BP 134/78 | HR 90 | Ht 66.0 in | Wt 152.0 lb

## 2023-08-13 DIAGNOSIS — N3090 Cystitis, unspecified without hematuria: Secondary | ICD-10-CM

## 2023-08-13 DIAGNOSIS — R300 Dysuria: Secondary | ICD-10-CM | POA: Diagnosis not present

## 2023-08-13 LAB — POCT URINALYSIS DIPSTICK
Bilirubin, UA: NEGATIVE
Glucose, UA: NEGATIVE
Ketones, UA: NEGATIVE
Nitrite, UA: NEGATIVE
Protein, UA: NEGATIVE
Spec Grav, UA: 1.02 (ref 1.010–1.025)
Urobilinogen, UA: 0.2 E.U./dL
pH, UA: 6 (ref 5.0–8.0)

## 2023-08-13 MED ORDER — NITROFURANTOIN MONOHYD MACRO 100 MG PO CAPS
100.0000 mg | ORAL_CAPSULE | Freq: Two times a day (BID) | ORAL | 0 refills | Status: AC
Start: 2023-08-13 — End: 2023-08-20

## 2023-08-13 NOTE — Progress Notes (Signed)
Date:  08/13/2023   Name:  Rebecca Robbins   DOB:  16-Mar-1946   MRN:  [id number]   Chief Complaint: Urinary Tract Infection (Started yesterday morning with pressure, burning and pain when urinating)  Urinary Tract Infection  This is a chronic problem. The current episode started more than 1 year ago. The problem occurs intermittently. The problem has been waxing and waning. Quality: pressure. The fever has been present for Less than 1 day. Associated symptoms include frequency and urgency. Pertinent negatives include no chills, discharge or hematuria. Her past medical history is significant for recurrent UTIs.    Lab Results  Component Value Date   NA 142 03/31/2023   K 4.6 03/31/2023   CO2 20 03/31/2023   GLUCOSE 138 (H) 03/31/2023   BUN 8 03/31/2023   CREATININE 0.70 03/31/2023   CALCIUM 10.7 (H) 03/31/2023   EGFR 89 03/31/2023   GFRNONAA 89 12/18/2020   Lab Results  Component Value Date   CHOL 204 (H) 03/31/2023   HDL 76 03/31/2023   LDLCALC 109 (H) 03/31/2023   TRIG 106 03/31/2023   CHOLHDL 3.1 08/23/2018   Lab Results  Component Value Date   TSH 3.350 03/31/2023   Lab Results  Component Value Date   HGBA1C 6.9 (H) 03/31/2023   No results found for: "WBC", "HGB", "HCT", "MCV", "PLT" Lab Results  Component Value Date   ALT 16 03/31/2023   AST 21 03/31/2023   GGT 29 05/21/2022   ALKPHOS 87 03/31/2023   BILITOT 0.6 03/31/2023   No results found for: "25OHVITD2", "25OHVITD3", "VD25OH"   Review of Systems  Constitutional:  Negative for chills.  Eyes:  Negative for visual disturbance.  Respiratory:  Negative for choking, shortness of breath and wheezing.   Cardiovascular:  Negative for chest pain and palpitations.  Gastrointestinal:  Negative for abdominal pain, blood in stool and diarrhea.  Genitourinary:  Positive for difficulty urinating, frequency and urgency. Negative for dysuria and hematuria.    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    Allergies  Allergen Reactions   Azithromycin Rash   Hydrocodone-Acetaminophen Nausea Only and Rash   Penicillins Other (See Comments)    Does not work for patient    Past Surgical History:  Procedure Laterality Date   BREAST BIOPSY Right 2004   neg   CHOLECYSTECTOMY     COLONOSCOPY  2015   repeat 10 yrs- Dr Servando Snare   MENISCUS REPAIR Bilateral    Right x2, Left x1    Social History   Tobacco Use   Smoking status: Never   Smokeless tobacco: Never  Vaping Use   Vaping status: Never Used  Substance Use Topics   Alcohol use: Yes    Alcohol/week: 3.0 standard drinks of alcohol    Types: 3 Glasses of wine per week   Drug use: No     Medication list has been reviewed and updated.  Current Meds  Medication Sig   cholestyramine light (PREVALITE) 4 GM/DOSE powder TAKE 1 TEAPOONFUL BY MOUTH  EVERY DAY AS DIRECTED   ezetimibe (ZETIA) 10 MG tablet TAKE 1 TABLET BY MOUTH EVERY DAY   glipiZIDE (GLUCOTROL) 5 MG tablet Take 1 tablet (5 mg total) by mouth daily before breakfast.   levothyroxine (SYNTHROID) 25 MCG tablet Take 1 tablet (25 mcg total) by mouth daily.   losartan-hydrochlorothiazide (HYZAAR) 50-12.5 MG tablet Take 1 tablet by mouth daily.   metFORMIN (GLUCOPHAGE-XR) 750 MG 24 hr tablet TAKE 1 TABLET BY MOUTH EVERY DAY WITH BREAKFAST   mupirocin ointment (BACTROBAN) 2 % Apply 1 Application topically 2 (two) times daily.   pantoprazole (PROTONIX) 40 MG tablet TAKE 1  TABLET BY MOUTH EVERY DAY   triamcinolone (NASACORT) 55 MCG/ACT AERO nasal inhaler Place 2 sprays into the nose daily.       05/12/2023   10:13 AM 03/31/2023    9:55 AM 10/27/2022   11:27 AM 07/24/2022    8:23 AM  GAD 7 : Generalized Anxiety Score  Nervous, Anxious, on Edge 0 0 0 1  Control/stop worrying 0 0 0 1  Worry too much - different things 0 0 0 1  Trouble relaxing 0 0 0 1  Restless 0 0 0 0  Easily annoyed or irritable 0 0 0 1  Afraid - awful might happen 0 0 0 0  Total GAD 7 Score 0 0 0 5  Anxiety Difficulty Not difficult at all Not difficult at all Not difficult at all Not difficult at all       05/12/2023   10:12 AM 03/31/2023    9:55 AM 10/27/2022   11:27 AM  Depression screen PHQ 2/9  Decreased Interest 0 0 0  Down, Depressed, Hopeless 0 0 0  PHQ - 2 Score 0 0 0  Altered sleeping 0 2 0  Tired, decreased energy 0 1 0  Change in appetite 0 0 0  Feeling bad or failure about yourself  0 0 0  Trouble concentrating 0 0 0  Moving slowly or fidgety/restless 0 0 0  Suicidal thoughts 0 0 0  PHQ-9 Score 0 3 0  Difficult doing work/chores Not difficult at all Somewhat difficult Not difficult at all    BP Readings from Last 3 Encounters:  08/13/23 134/78  05/12/23 (!) 150/86  05/12/23 (!) 150/86    Physical Exam HENT:     Right Ear: Tympanic membrane and ear canal normal.     Left Ear: Tympanic membrane and ear canal normal.     Nose: Nose normal.     Mouth/Throat:     Mouth: Mucous membranes are moist.     Pharynx: No oropharyngeal exudate.  Cardiovascular:     Rate and Rhythm: Normal rate and regular rhythm.     Pulses: Normal pulses.     Heart sounds: No murmur heard.    No friction rub. No gallop.  Pulmonary:     Breath sounds: No wheezing, rhonchi or rales.  Abdominal:     Tenderness: There is no abdominal tenderness. There is no guarding or rebound.     Wt Readings from Last 3 Encounters:  08/13/23 152 lb (68.9 kg)  05/12/23 150 lb (68 kg)   05/12/23 150 lb (68 kg)    BP 134/78 (BP Location: Right Arm, Cuff Size: Normal)   Pulse 90   Ht 5\' 6"  (1.676 m)   Wt 152 lb (68.9 kg)   SpO2 96%   BMI 24.53 kg/m   Assessment and Plan:  1. Dysuria Chronic.  Episodic.  Stable.  Onset of suprapubic pressure for several weeks which is almost baseline and patient has felt that she has incomplete emptying of bladder.  Dipstick of urine was done and it was noted to have leukocytes and erythrocytes.  Urine was sent for culture. - POCT urinalysis dipstick - Urine Culture  2. Cystitis New onset.  Episodic.  Stable.  Point-of-care urinalysis indicates cystitis and will treat as previously in May with Macrobid 100 mg twice a day for 5 to 7 days.  Will recheck in 2 weeks to see if complete resolution at which time we will evaluate for a second concern. - nitrofurantoin, macrocrystal-monohydrate, (MACROBID) 100 MG capsule; Take 1 capsule (100 mg total) by mouth 2 (two) times daily for 7 days.  Dispense: 14 capsule; Refill: 0   Patient will return in roughly 3 weeks for evaluation of palpitations which lasted "not very long "ended determine if patient is going to need 24-hour monitoring. Elizabeth Sauer, MD

## 2023-08-14 ENCOUNTER — Encounter: Payer: Self-pay | Admitting: Family Medicine

## 2023-08-14 ENCOUNTER — Ambulatory Visit: Payer: Self-pay | Admitting: *Deleted

## 2023-08-14 NOTE — Telephone Encounter (Signed)
  Chief Complaint: Was given an antibiotic for UTI yesterday by Dr. Yetta Barre.   Dr. Yetta Barre forgot the medication for the yeast infection the antibiotic always gives her.    Symptoms: Being treated for UTI  Frequency: not asked Pertinent Negatives: Patient denies N/A Disposition: [] ED /[] Urgent Care (no appt availability in office) / [] Appointment(In office/virtual)/ []  [place] [facility]/ [] Home Care/ [] Refused Recommended Disposition /[] [place] [facility]/ [x]  Follow-up with PCP Additional Notes: Would Dr. Yetta Barre call in something for yeast infection that is caused by the antibiotic.    She forgot to do that yesterday.

## 2023-08-14 NOTE — Telephone Encounter (Signed)
Message from Rebecca Robbins sent at 08/14/2023  9:55 AM EDT  Summary: med request   Pt saw Dr Yetta Barre yesterday for a UTI.  Given a abx for uti. But pt states Dr Yetta Barre forgot the med for a yeast infection that abx always gives her.  Please advise          Call History  Contact Date/Time Type Contact Phone/Fax User  08/14/2023 09:55 AM EDT Phone (Incoming) [place], [place] E (Self) [phone] Rexene Edison) Crist Infante   Reason for Disposition  [1] Caller has URGENT medicine question about med that PCP or specialist prescribed AND [2] triager unable to answer question  Answer Assessment - Initial Assessment Questions 1. NAME of MEDICINE: "What medicine(s) are you calling about?"     See pt's note.  Saw Dr. Yetta Barre yesterday and was given an antibiotic.  Dr. Yetta Barre forgot the medication for a yeast infection that the antibiotic always gives her. 2. QUESTION: "What is your question?" (e.g., double dose of medicine, side effect)     Can Dr. Yetta Barre send in something for yeast infection? 3. PRESCRIBER: "Who prescribed the medicine?" Reason: if prescribed by specialist, call should be referred to that group.     Dr. Yetta Barre 4. SYMPTOMS: "Do you have any symptoms?" If Yes, ask: "What symptoms are you having?"  "How bad are the symptoms (e.g., mild, moderate, severe)     Not asked 5. PREGNANCY:  "Is there any chance that you are pregnant?" "When was your last menstrual period?"     Not asked  Protocols used: Medication Question Call-A-AH

## 2023-08-14 NOTE — Telephone Encounter (Signed)
Duplicate   Pt sent message on Mychart.  KP

## 2023-08-15 LAB — URINE CULTURE

## 2023-08-17 ENCOUNTER — Encounter: Payer: Self-pay | Admitting: Family Medicine

## 2023-08-18 ENCOUNTER — Other Ambulatory Visit: Payer: Self-pay

## 2023-08-18 DIAGNOSIS — R300 Dysuria: Secondary | ICD-10-CM

## 2023-08-18 DIAGNOSIS — N3090 Cystitis, unspecified without hematuria: Secondary | ICD-10-CM

## 2023-08-18 NOTE — Progress Notes (Signed)
Placed ref to uro 

## 2023-09-03 ENCOUNTER — Ambulatory Visit: Payer: Medicare Other | Admitting: Family Medicine

## 2023-09-17 ENCOUNTER — Ambulatory Visit: Payer: Medicare Other | Admitting: Family Medicine

## 2023-09-21 ENCOUNTER — Ambulatory Visit (INDEPENDENT_AMBULATORY_CARE_PROVIDER_SITE_OTHER): Payer: Medicare Other | Admitting: Urology

## 2023-09-21 ENCOUNTER — Encounter: Payer: Self-pay | Admitting: Urology

## 2023-09-21 DIAGNOSIS — N3020 Other chronic cystitis without hematuria: Secondary | ICD-10-CM

## 2023-09-21 DIAGNOSIS — Z87440 Personal history of urinary (tract) infections: Secondary | ICD-10-CM | POA: Diagnosis not present

## 2023-09-21 DIAGNOSIS — N3000 Acute cystitis without hematuria: Secondary | ICD-10-CM | POA: Diagnosis not present

## 2023-09-21 DIAGNOSIS — R1032 Left lower quadrant pain: Secondary | ICD-10-CM

## 2023-09-21 DIAGNOSIS — N390 Urinary tract infection, site not specified: Secondary | ICD-10-CM

## 2023-09-21 LAB — MICROSCOPIC EXAMINATION: Epithelial Cells (non renal): >10 /[HPF] — AB (ref 0–10)

## 2023-09-21 LAB — URINALYSIS, COMPLETE
Bilirubin, UA: NEGATIVE
Glucose, UA: NEGATIVE
Ketones, UA: NEGATIVE
Nitrite, UA: NEGATIVE
Protein,UA: NEGATIVE
RBC, UA: NEGATIVE
Specific Gravity, UA: 1.01 (ref 1.005–1.030)
Urobilinogen, Ur: 0.2 mg/dL (ref 0.2–1.0)
pH, UA: 5 (ref 5.0–7.5)

## 2023-09-21 NOTE — Patient Instructions (Signed)

## 2023-09-21 NOTE — Progress Notes (Signed)
09/21/2023 8:26 AM   Rebecca Robbins 05/15/1946 [id number]  Referring provider: Duanne Limerick, MD [address] [place],  [place] [address]  Chief Complaint  Patient presents with   New Patient (Initial Visit)   Cystitis    HPI: Consulted to assess the patient's 2 recent urinary tract infections.  Since May she has had urgency pain and frequency that respond favorably antibiotics.  She had the same symptoms 3 months later but the culture may have been negative.  Today she had some left lower quadrant discomfort.  She voids approximately every hour worse with fluids.  She gets up at least twice at night.  Flow was reasonable and she can double void with positional changes a small amount  She is continent.  No hysterectomy.  No history of kidney stones bladder surgery or bladder infections.  She is on oral hypoglycemics   PMH: Past Medical History:  Diagnosis Date   Diabetes mellitus without complication (HCC)    GERD (gastroesophageal reflux disease)    Hypothyroidism    Irritable bowel syndrome    PONV (postoperative nausea and vomiting)    Thyroid disease     Surgical History: Past Surgical History:  Procedure Laterality Date   BREAST BIOPSY Right 2004   neg   CHOLECYSTECTOMY     COLONOSCOPY  2015   repeat 10 yrs- Dr Servando Snare   MENISCUS REPAIR Bilateral    Right x2, Left x1    Home Medications:  Allergies as of 09/21/2023       Reactions   Azithromycin Rash   Hydrocodone-acetaminophen Nausea Only, Rash   Penicillins Other (See Comments)   Does not work for patient        Medication List        Accurate as of September 21, 2023  8:26 AM. If you have any questions, ask your nurse or doctor.          cholestyramine light 4 GM/DOSE powder Commonly known as: PREVALITE TAKE 1 TEAPOONFUL BY MOUTH EVERY DAY AS DIRECTED   ezetimibe 10 MG tablet Commonly known as: ZETIA TAKE 1 TABLET BY MOUTH EVERY DAY   glipiZIDE 5 MG tablet Commonly  known as: Glucotrol Take 1 tablet (5 mg total) by mouth daily before breakfast.   hyoscyamine 0.125 MG SL tablet Commonly known as: LEVSIN SL Place 1 tablet (0.125 mg total) under the tongue every 4 (four) hours as needed.   levothyroxine 25 MCG tablet Commonly known as: SYNTHROID Take 1 tablet (25 mcg total) by mouth daily.   losartan-hydrochlorothiazide 50-12.5 MG tablet Commonly known as: HYZAAR Take 1 tablet by mouth daily.   metFORMIN 750 MG 24 hr tablet Commonly known as: GLUCOPHAGE-XR TAKE 1 TABLET BY MOUTH EVERY DAY WITH BREAKFAST   mupirocin ointment 2 % Commonly known as: BACTROBAN Apply 1 Application topically 2 (two) times daily.   pantoprazole 40 MG tablet Commonly known as: PROTONIX TAKE 1 TABLET BY MOUTH EVERY DAY   triamcinolone 55 MCG/ACT Aero nasal inhaler Commonly known as: NASACORT Place 2 sprays into the nose daily.        Allergies:  Allergies  Allergen Reactions   Azithromycin Rash   Hydrocodone-Acetaminophen Nausea Only and Rash   Penicillins Other (See Comments)    Does not work for patient    Family History: Family History  Problem Relation Age of Onset   Cancer Mother    Diabetes Mother    Cancer Father    Heart disease Maternal Grandmother  Heart disease Paternal Grandmother    Breast cancer Neg Hx     Social History:  reports that she has never smoked. She has never been exposed to tobacco smoke. She has never used smokeless tobacco. She reports current alcohol use of about 3.0 standard drinks of alcohol per week. She reports that she does not use drugs.  ROS:                                        Physical Exam: There were no vitals taken for this visit.    Laboratory Data: No results found for: "WBC", "HGB", "HCT", "MCV", "PLT"  Lab Results  Component Value Date   CREATININE 0.70 03/31/2023    No results found for: "PSA"  No results found for: "TESTOSTERONE"  Lab Results  Component  Value Date   HGBA1C 6.9 (H) 03/31/2023    Urinalysis    Component Value Date/Time   BILIRUBINUR negative 08/13/2023 1555   PROTEINUR Negative 08/13/2023 1555   UROBILINOGEN 0.2 08/13/2023 1555   NITRITE negative 08/13/2023 1555   LEUKOCYTESUR Moderate (2+) (A) 08/13/2023 1555    Pertinent Imaging: Urine reviewed and sent for culture.  Chart reviewed  Assessment & Plan: Patient's had 2 recent urinary tract infections.  I reviewed the medical record and she has had 1 positive culture 1 negative.  Urine looked normal today by sent it for culture and call if positive.  Return with renal ultrasound and for cystoscopy.  Currently treat infections as needed but in the future she may need prophylaxis.  On further questioning patient says she has little bit of suprapubic pressure but likely this is an element of an overactive bladder.  She is continent but voids approximately every hour especially after coffee.  I spoke to her about vaginal dryness which I will examine her for next time.  She does have some dyspareunia and may follow-up with oncologist.  She has not had a hysterectomy.  I will examine her for atrophy next time as well    1. Acute cystitis without hematuria  - Urinalysis, Complete   No follow-ups on file.  Martina Sinner, MD  [facility] [address] [place], [place] [address] [phone]

## 2023-09-24 ENCOUNTER — Ambulatory Visit (INDEPENDENT_AMBULATORY_CARE_PROVIDER_SITE_OTHER): Payer: Medicare Other | Admitting: Family Medicine

## 2023-09-24 ENCOUNTER — Encounter: Payer: Self-pay | Admitting: Family Medicine

## 2023-09-24 VITALS — BP 128/78 | HR 86 | Ht 66.0 in | Wt 151.0 lb

## 2023-09-24 DIAGNOSIS — E039 Hypothyroidism, unspecified: Secondary | ICD-10-CM

## 2023-09-24 DIAGNOSIS — K580 Irritable bowel syndrome with diarrhea: Secondary | ICD-10-CM

## 2023-09-24 DIAGNOSIS — K219 Gastro-esophageal reflux disease without esophagitis: Secondary | ICD-10-CM | POA: Diagnosis not present

## 2023-09-24 DIAGNOSIS — I10 Essential (primary) hypertension: Secondary | ICD-10-CM

## 2023-09-24 DIAGNOSIS — Z7984 Long term (current) use of oral hypoglycemic drugs: Secondary | ICD-10-CM | POA: Diagnosis not present

## 2023-09-24 DIAGNOSIS — E7801 Familial hypercholesterolemia: Secondary | ICD-10-CM | POA: Diagnosis not present

## 2023-09-24 DIAGNOSIS — E119 Type 2 diabetes mellitus without complications: Secondary | ICD-10-CM | POA: Diagnosis not present

## 2023-09-24 DIAGNOSIS — K760 Fatty (change of) liver, not elsewhere classified: Secondary | ICD-10-CM

## 2023-09-24 LAB — CULTURE, URINE COMPREHENSIVE

## 2023-09-24 MED ORDER — LEVOTHYROXINE SODIUM 25 MCG PO TABS
25.0000 ug | ORAL_TABLET | Freq: Every day | ORAL | 1 refills | Status: DC
Start: 2023-09-24 — End: 2024-01-25

## 2023-09-24 MED ORDER — LOSARTAN POTASSIUM-HCTZ 50-12.5 MG PO TABS
1.0000 | ORAL_TABLET | Freq: Every day | ORAL | 1 refills | Status: DC
Start: 2023-09-24 — End: 2024-01-25

## 2023-09-24 MED ORDER — GLIPIZIDE 5 MG PO TABS
5.0000 mg | ORAL_TABLET | Freq: Every day | ORAL | 1 refills | Status: DC
Start: 2023-09-24 — End: 2024-01-25

## 2023-09-24 MED ORDER — PANTOPRAZOLE SODIUM 40 MG PO TBEC
40.0000 mg | DELAYED_RELEASE_TABLET | Freq: Every day | ORAL | 1 refills | Status: DC
Start: 2023-09-24 — End: 2024-01-25

## 2023-09-24 MED ORDER — METFORMIN HCL ER 750 MG PO TB24
ORAL_TABLET | ORAL | 1 refills | Status: DC
Start: 2023-09-24 — End: 2024-01-25

## 2023-09-24 MED ORDER — CHOLESTYRAMINE LIGHT 4 GM/DOSE PO POWD
ORAL | 1 refills | Status: DC
Start: 2023-09-24 — End: 2024-01-25

## 2023-09-24 MED ORDER — EZETIMIBE 10 MG PO TABS
10.0000 mg | ORAL_TABLET | Freq: Every day | ORAL | 1 refills | Status: DC
Start: 2023-09-24 — End: 2024-01-25

## 2023-09-24 NOTE — Progress Notes (Signed)
Date:  09/24/2023   Name:  [place] E Maule   DOB:  May 01, 1946   MRN:  [id number]   Chief Complaint: Diabetes, Hyperlipidemia, Hypertension, and Gastroesophageal Reflux  Diabetes She presents for her follow-up diabetic visit. She has type 2 diabetes mellitus. Her disease course has been stable. There are no hypoglycemic associated symptoms. Associated symptoms include fatigue. Pertinent negatives for diabetes include no blurred vision, no chest pain, no foot paresthesias, no foot ulcerations, no polydipsia, no polyphagia, no polyuria, no visual change, no weakness and no weight loss. There are no hypoglycemic complications. Symptoms are stable. There are no diabetic complications. Pertinent negatives for diabetic complications include no CVA. Risk factors for coronary artery disease include dyslipidemia and diabetes mellitus. Current diabetic treatment includes oral agent (dual therapy). She is compliant with treatment most of the time.  Hyperlipidemia This is a chronic problem. The current episode started more than 1 year ago. The problem is controlled. Recent lipid tests were reviewed and are normal. Pertinent negatives include no chest pain or shortness of breath. Current antihyperlipidemic treatment includes ezetimibe. The current treatment provides moderate improvement of lipids. There are no compliance problems.   Hypertension This is a chronic problem. The current episode started more than 1 year ago. The problem has been waxing and waning since onset. The problem is controlled. Pertinent negatives include no blurred vision, chest pain, palpitations or shortness of breath. Past treatments include angiotensin blockers and diuretics. The current treatment provides moderate improvement. There is no history of angina, CAD/MI or CVA. Identifiable causes of hypertension include a thyroid problem.  Gastroesophageal Reflux She reports no abdominal pain, no chest pain, no dysphagia, no heartburn, no  nausea or no wheezing. This is a chronic problem. The current episode started more than 1 year ago. The problem has been gradually improving. Associated symptoms include fatigue. Pertinent negatives include no weight loss. She has tried a PPI for the symptoms.  Thyroid Problem Presents for follow-up visit. Symptoms include fatigue. Patient reports no cold intolerance, constipation, diaphoresis, dry skin, heat intolerance, nail problem, palpitations, visual change or weight loss. Her past medical history is significant for hyperlipidemia.    Lab Results  Component Value Date   NA 142 03/31/2023   K 4.6 03/31/2023   CO2 20 03/31/2023   GLUCOSE 138 (H) 03/31/2023   BUN 8 03/31/2023   CREATININE 0.70 03/31/2023   CALCIUM 10.7 (H) 03/31/2023   EGFR 89 03/31/2023   GFRNONAA 89 12/18/2020   Lab Results  Component Value Date   CHOL 204 (H) 03/31/2023   HDL 76 03/31/2023   LDLCALC 109 (H) 03/31/2023   TRIG 106 03/31/2023   CHOLHDL 3.1 08/23/2018   Lab Results  Component Value Date   TSH 3.350 03/31/2023   Lab Results  Component Value Date   HGBA1C 6.9 (H) 03/31/2023   No results found for: "WBC", "HGB", "HCT", "MCV", "PLT" Lab Results  Component Value Date   ALT 16 03/31/2023   AST 21 03/31/2023   GGT 29 05/21/2022   ALKPHOS 87 03/31/2023   BILITOT 0.6 03/31/2023   No results found for: "25OHVITD2", "25OHVITD3", "VD25OH"   Review of Systems  Constitutional:  Positive for fatigue. Negative for diaphoresis and weight loss.  Eyes:  Negative for blurred vision and visual disturbance.  Respiratory:  Negative for chest tightness, shortness of breath and wheezing.   Cardiovascular:  Negative for chest pain and palpitations.  Gastrointestinal:  Negative for abdominal pain, blood in stool, constipation, dysphagia, heartburn  and nausea.  Endocrine: Negative for cold intolerance, heat intolerance, polydipsia, polyphagia and polyuria.  Genitourinary:  Negative for hematuria.   Neurological:  Negative for weakness.    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    Allergies  Allergen Reactions   Azithromycin Rash   Hydrocodone-Acetaminophen Nausea Only and Rash   Penicillins Other (See Comments)    Does not work for patient    Past Surgical History:  Procedure Laterality Date   BREAST BIOPSY Right 2004   neg   CHOLECYSTECTOMY     COLONOSCOPY  2015   repeat 10 yrs- Dr Servando Snare   MENISCUS REPAIR Bilateral    Right x2, Left x1    Social History   Tobacco Use   Smoking status: Never    Passive exposure: Never   Smokeless tobacco: Never  Vaping Use   Vaping status: Never Used  Substance Use Topics   Alcohol use: Yes    Alcohol/week: 3.0 standard drinks of alcohol    Types: 3 Glasses of wine per week   Drug use: No     Medication list has been reviewed and updated.  Current Meds  Medication Sig   cholestyramine light (PREVALITE) 4 GM/DOSE powder TAKE 1 TEAPOONFUL BY MOUTH EVERY DAY AS DIRECTED   ezetimibe (ZETIA) 10 MG tablet TAKE 1 TABLET BY MOUTH EVERY DAY   glipiZIDE (GLUCOTROL) 5 MG tablet Take 1 tablet (5 mg total) by mouth daily before breakfast.   hyoscyamine (LEVSIN SL) 0.125 MG SL tablet Place 1 tablet (0.125 mg total) under the tongue every 4 (four) hours as needed.   levothyroxine (SYNTHROID) 25 MCG tablet Take 1 tablet (25 mcg total) by mouth daily.   losartan-hydrochlorothiazide (HYZAAR) 50-12.5 MG tablet Take 1 tablet  by mouth daily.   metFORMIN (GLUCOPHAGE-XR) 750 MG 24 hr tablet TAKE 1 TABLET BY MOUTH EVERY DAY WITH BREAKFAST   pantoprazole (PROTONIX) 40 MG tablet TAKE 1 TABLET BY MOUTH EVERY DAY       09/24/2023    1:22 PM 05/12/2023   10:13 AM 03/31/2023    9:55 AM 10/27/2022   11:27 AM  GAD 7 : Generalized Anxiety Score  Nervous, Anxious, on Edge 1 0 0 0  Control/stop worrying 1 0 0 0  Worry too much - different things 1 0 0 0  Trouble relaxing 1 0 0 0  Restless 0 0 0 0  Easily annoyed or irritable 0 0 0 0  Afraid - awful might happen 0 0 0 0  Total GAD 7 Score 4 0 0 0  Anxiety Difficulty Not difficult at all Not difficult at all Not difficult at all Not difficult at all       09/24/2023    1:22 PM 05/12/2023   10:12 AM 03/31/2023    9:55 AM  Depression screen PHQ 2/9  Decreased Interest 0 0 0  Down, Depressed, Hopeless 0 0 0  PHQ - 2 Score 0 0 0  Altered sleeping 1 0 2  Tired, decreased energy 2 0 1  Change in appetite 0 0 0  Feeling bad or failure about yourself  0 0 0  Trouble concentrating 0 0 0  Moving slowly or fidgety/restless 0 0 0  Suicidal thoughts 0 0 0  PHQ-9 Score 3 0 3  Difficult doing work/chores Not difficult at all Not difficult at all Somewhat difficult    BP Readings from Last 3 Encounters:  09/24/23 128/78  08/13/23 134/78  05/12/23 (!) 150/86    Physical Exam Vitals and nursing note reviewed.  HENT:     Right Ear: Tympanic membrane and ear canal normal.     Left Ear: Tympanic membrane and ear canal normal.     Nose: Nose normal.  Cardiovascular:     Rate and Rhythm: Normal rate and regular rhythm.     Heart sounds: No murmur heard.    No friction rub. No gallop.  Pulmonary:     Breath sounds: No wheezing, rhonchi or rales.  Abdominal:     Palpations: There is no hepatomegaly or splenomegaly.     Tenderness: There is no abdominal tenderness. There is no guarding or rebound.  Musculoskeletal:     Cervical back: Neck supple.     Wt Readings  from Last 3 Encounters:  09/24/23 151 lb (68.5 kg)  08/13/23 152 lb (68.9 kg)  05/12/23 150 lb (68 kg)    BP 128/78   Pulse 86   Ht 5\' 6"  (1.676 m)   Wt 151 lb (68.5 kg)   SpO2 95%   BMI 24.37 kg/m   Assessment and Plan: 1. Diabetes mellitus treated with oral medication (HCC) Chronic.  Controlled.  Stable.  Asymptomatic.  Tolerating medications well.  Continue glipizide 5 mg once a day.  Continue metformin XR 750 mg once a day.  Will check CMP for electrolytes and GFR as well as A1c for level of diabetic control.  Will recheck in 4 months. - glipiZIDE (GLUCOTROL) 5 MG tablet; Take 1 tablet (5 mg total) by mouth daily before breakfast.  Dispense: 90 tablet; Refill: 1 - metFORMIN (GLUCOPHAGE-XR) 750 MG 24 hr tablet; TAKE 1 TABLET BY MOUTH EVERY DAY WITH BREAKFAST  Dispense: 90 tablet; Refill: 1 - Comprehensive metabolic panel - Hemoglobin A1c  2. Primary hypertension Chronic.  Controlled.  Stable.  Blood pressure today is 128/78.  Asymptomatic.  Tolerating medications well.  Continue losartan hydrochlorothiazide 50-12.5 mg once a day.  Will continue comprehensive metabolic panel for electrolytes and GFR. - losartan-hydrochlorothiazide (HYZAAR) 50-12.5 MG tablet; Take 1 tablet by mouth daily.  Dispense: 90 tablet; Refill: 1 - Comprehensive metabolic panel  3. Familial hypercholesterolemia Chronic.  Controlled.  Stable.  Continue Zetia 10 mg once a day.  Will check lipid panel for current level of LDL control. - ezetimibe (ZETIA) 10 MG tablet; Take 1 tablet (10 mg total) by mouth daily.  Dispense: 90 tablet; Refill: 1 - Lipid Panel With LDL/HDL Ratio  4. Steatosis of liver As noted above. - ezetimibe (ZETIA) 10 MG tablet; Take 1 tablet (10 mg total) by mouth daily.  Dispense: 90 tablet; Refill: 1 - Lipid Panel With LDL/HDL Ratio  5. Irritable bowel syndrome with diarrhea Chronic.  Controlled.  Stable.  Continue cholestyramine 1 teaspoon every day. - cholestyramine light (PREVALITE)  4 GM/DOSE powder; TAKE 1 TEAPOONFUL BY MOUTH EVERY DAY AS DIRECTED  Dispense: 231 g; Refill: 1  6. Gastroesophageal reflux disease, unspecified whether esophagitis present Chronic.  Controlled.  Stable.  Asymptomatic with control of heartburn except for occasional margaritas which breakthrough.  Will continue pantoprazole 40 mg once a day. - pantoprazole (PROTONIX) 40 MG tablet; Take 1 tablet (40 mg total) by mouth daily.  Dispense: 90 tablet; Refill: 1  7. Adult hypothyroidism Chronic.  Controlled.  Stable.  Asymptomatic.  Currently tolerating levothyroxine 25 mcg daily and we will check current TSH for level of control and adjust accordingly. - levothyroxine (SYNTHROID) 25 MCG tablet; Take 1 tablet (25 mcg total) by mouth daily.  Dispense: 90 tablet; Refill: 1 - TSH     Elizabeth Sauer, MD

## 2023-09-25 DIAGNOSIS — Z7984 Long term (current) use of oral hypoglycemic drugs: Secondary | ICD-10-CM | POA: Diagnosis not present

## 2023-09-25 DIAGNOSIS — K760 Fatty (change of) liver, not elsewhere classified: Secondary | ICD-10-CM | POA: Diagnosis not present

## 2023-09-25 DIAGNOSIS — E119 Type 2 diabetes mellitus without complications: Secondary | ICD-10-CM | POA: Diagnosis not present

## 2023-09-25 DIAGNOSIS — I10 Essential (primary) hypertension: Secondary | ICD-10-CM | POA: Diagnosis not present

## 2023-09-25 DIAGNOSIS — E7801 Familial hypercholesterolemia: Secondary | ICD-10-CM | POA: Diagnosis not present

## 2023-09-25 DIAGNOSIS — E039 Hypothyroidism, unspecified: Secondary | ICD-10-CM | POA: Diagnosis not present

## 2023-09-26 LAB — COMPREHENSIVE METABOLIC PANEL
ALT: 19 [IU]/L (ref 0–32)
AST: 22 [IU]/L (ref 0–40)
Albumin: 4.6 g/dL (ref 3.8–4.8)
Alkaline Phosphatase: 89 [IU]/L (ref 44–121)
BUN/Creatinine Ratio: 19 (ref 12–28)
BUN: 12 mg/dL (ref 8–27)
Bilirubin Total: 0.6 mg/dL (ref 0.0–1.2)
CO2: 18 mmol/L — ABNORMAL LOW (ref 20–29)
Calcium: 9.7 mg/dL (ref 8.7–10.3)
Chloride: 101 mmol/L (ref 96–106)
Creatinine, Ser: 0.63 mg/dL (ref 0.57–1.00)
Globulin, Total: 2.4 g/dL (ref 1.5–4.5)
Glucose: 155 mg/dL — ABNORMAL HIGH (ref 70–99)
Potassium: 3.9 mmol/L (ref 3.5–5.2)
Sodium: 137 mmol/L (ref 134–144)
Total Protein: 7 g/dL (ref 6.0–8.5)
eGFR: 91 mL/min/{1.73_m2} (ref >59)

## 2023-09-26 LAB — LIPID PANEL WITH LDL/HDL RATIO
Cholesterol, Total: 191 mg/dL (ref 100–199)
HDL: 62 mg/dL (ref >39)
LDL Chol Calc (NIH): 109 mg/dL — ABNORMAL HIGH (ref 0–99)
LDL/HDL Ratio: 1.8 {ratio} (ref 0.0–3.2)
Triglycerides: 113 mg/dL (ref 0–149)
VLDL Cholesterol Cal: 20 mg/dL (ref 5–40)

## 2023-09-26 LAB — HEMOGLOBIN A1C
Est. average glucose Bld gHb Est-mCnc: 160 mg/dL
Hgb A1c MFr Bld: 7.2 % — ABNORMAL HIGH (ref 4.8–5.6)

## 2023-09-26 LAB — TSH: TSH: 3.93 u[IU]/mL (ref 0.450–4.500)

## 2023-09-30 ENCOUNTER — Ambulatory Visit
Admission: RE | Admit: 2023-09-30 | Discharge: 2023-09-30 | Disposition: A | Discharge status: Home / Self Care | Payer: Medicare Other | Source: Ambulatory Visit | Attending: Urology | Admitting: Urology

## 2023-09-30 DIAGNOSIS — N3020 Other chronic cystitis without hematuria: Secondary | ICD-10-CM | POA: Diagnosis not present

## 2023-09-30 DIAGNOSIS — N390 Urinary tract infection, site not specified: Secondary | ICD-10-CM | POA: Diagnosis not present

## 2023-09-30 DIAGNOSIS — N3000 Acute cystitis without hematuria: Secondary | ICD-10-CM | POA: Insufficient documentation

## 2023-11-02 ENCOUNTER — Encounter: Payer: Self-pay | Admitting: Urology

## 2023-11-02 ENCOUNTER — Ambulatory Visit: Payer: Medicare Other | Admitting: Urology

## 2023-11-02 VITALS — BP 171/81 | HR 109

## 2023-11-02 DIAGNOSIS — Z87440 Personal history of urinary (tract) infections: Secondary | ICD-10-CM

## 2023-11-02 DIAGNOSIS — Z09 Encounter for follow-up examination after completed treatment for conditions other than malignant neoplasm: Secondary | ICD-10-CM | POA: Diagnosis not present

## 2023-11-02 DIAGNOSIS — N3000 Acute cystitis without hematuria: Secondary | ICD-10-CM

## 2023-11-02 LAB — URINALYSIS, COMPLETE
Bilirubin, UA: NEGATIVE
Ketones, UA: NEGATIVE
Leukocytes,UA: NEGATIVE
Nitrite, UA: NEGATIVE
Protein,UA: NEGATIVE
Specific Gravity, UA: 1.01 (ref 1.005–1.030)
Urobilinogen, Ur: 0.2 mg/dL (ref 0.2–1.0)
pH, UA: 5.5 (ref 5.0–7.5)

## 2023-11-02 LAB — MICROSCOPIC EXAMINATION

## 2023-11-02 NOTE — Progress Notes (Signed)
11/02/2023 9:10 AM   Rebecca Robbins 1946/02/07 [id number]  Referring provider: Duanne Limerick, MD [address] [place],  [place] [address]  Chief Complaint  Patient presents with   Cysto    HPI: I was consulted to assess the patient's 2 recent urinary tract infections.  Since May she has had urgency pain and frequency that respond favorably antibiotics.  She had the same symptoms 3 months later but the culture may have been negative.  Today she had some left lower quadrant discomfort.   She voids approximately every hour worse with fluids.  She gets up at least twice at night.  Flow was reasonable and she can double void with positional changes a small amount   She is continent.   No hysterectomy.   Patient's had 2 recent urinary tract infections.  I reviewed the medical record and she has had 1 positive culture 1 negative.  Urine looked normal today by sent it for culture and call if positive.  Return with renal ultrasound and for cystoscopy.  Currently treat infections as needed but in the future she may need prophylaxis.   On further questioning patient says she has little bit of suprapubic pressure but likely this is an element of an overactive bladder.  She is continent but voids approximately every hour especially after coffee.  I spoke to her about vaginal dryness which I will examine her for next time.  She does have some dyspareunia and may follow-up with gynecologist.  She has not had a hysterectomy.  I will examine her for atrophy next time as well  Today Frequency stable.  Last culture negative.  Ultrasound normal Clinically not infected today.  Still voiding with some pressure every hour but no significant pain On pelvic examination she grade 2 hypermobility the bladder neck.  No stress incontinence.  No prolapse.  Mild atrophy.  No tenderness Cystoscopy: Patient went flexible cystoscopy.  Bladder close and trigone were normal.  No cystitis.  No carcinoma.   No pain on bladder filling or insertion of the scope   PMH: Past Medical History:  Diagnosis Date   Diabetes mellitus without complication (HCC)    GERD (gastroesophageal reflux disease)    Hypothyroidism    Irritable bowel syndrome    PONV (postoperative nausea and vomiting)    Thyroid disease     Surgical History: Past Surgical History:  Procedure Laterality Date   BREAST BIOPSY Right 2004   neg   CHOLECYSTECTOMY     COLONOSCOPY  2015   repeat 10 yrs- Dr Servando Snare   MENISCUS REPAIR Bilateral    Right x2, Left x1    Home Medications:  Allergies as of 11/02/2023       Reactions   Azithromycin Rash   Hydrocodone-acetaminophen Nausea Only, Rash   Penicillins Other (See Comments)   Does not work for patient        Medication List        Accurate as of November 02, 2023  9:10 AM. If you have any questions, ask your nurse or doctor.          cholestyramine light 4 GM/DOSE powder Commonly known as: PREVALITE TAKE 1 TEAPOONFUL BY MOUTH EVERY DAY AS DIRECTED   ezetimibe 10 MG tablet Commonly known as: ZETIA Take 1 tablet (10 mg total) by mouth daily.   glipiZIDE 5 MG tablet Commonly known as: Glucotrol Take 1 tablet (5 mg total) by mouth daily before breakfast.   hyoscyamine 0.125 MG  SL tablet Commonly known as: LEVSIN SL Place 1 tablet (0.125 mg total) under the tongue every 4 (four) hours as needed.   levothyroxine 25 MCG tablet Commonly known as: SYNTHROID Take 1 tablet (25 mcg total) by mouth daily.   losartan-hydrochlorothiazide 50-12.5 MG tablet Commonly known as: HYZAAR Take 1 tablet by mouth daily.   metFORMIN 750 MG 24 hr tablet Commonly known as: GLUCOPHAGE-XR TAKE 1 TABLET BY MOUTH EVERY DAY WITH BREAKFAST   pantoprazole 40 MG tablet Commonly known as: PROTONIX Take 1 tablet (40 mg total) by mouth daily.        Allergies:  Allergies  Allergen Reactions   Azithromycin Rash   Hydrocodone-Acetaminophen Nausea Only and Rash    Penicillins Other (See Comments)    Does not work for patient    Family History: Family History  Problem Relation Age of Onset   Cancer Mother    Diabetes Mother    Cancer Father    Heart disease Maternal Grandmother    Heart disease Paternal Grandmother    Breast cancer Neg Hx     Social History:  reports that she has never smoked. She has never been exposed to tobacco smoke. She has never used smokeless tobacco. She reports current alcohol use of about 3.0 standard drinks of alcohol per week. She reports that she does not use drugs.  ROS:                                        Physical Exam: BP (!) 171/81   Pulse (!) 109   Constitutional:  Alert and oriented, No acute distress. HEENT: [place] AT, moist mucus membranes.  Trachea midline, no masses.  Laboratory Data: No results found for: "WBC", "HGB", "HCT", "MCV", "PLT"  Lab Results  Component Value Date   CREATININE 0.63 09/25/2023    No results found for: "PSA"  No results found for: "TESTOSTERONE"  Lab Results  Component Value Date   HGBA1C 7.2 (H) 09/25/2023    Urinalysis    Component Value Date/Time   APPEARANCEUR Clear 09/21/2023 0810   GLUCOSEU Negative 09/21/2023 0810   BILIRUBINUR Negative 09/21/2023 0810   PROTEINUR Negative 09/21/2023 0810   UROBILINOGEN 0.2 08/13/2023 1555   NITRITE Negative 09/21/2023 0810   LEUKOCYTESUR Trace (A) 09/21/2023 0810    Pertinent Imaging:   Assessment & Plan: Patient has some pressure and frequency.  There is not enough evidence to put her on urinary prophylaxis.  Role of overactive bladder medication discussed for frequency and mild pressure.  Get urine cultures as needed Reassurance given and I will see her as needed.  Get culture for symptoms moving forward she did not want a once a day pill.  I actually also talked her about urinary prophylaxis but she did not want that either and I agree with her as I do not think there is enough proof at  this stage  1. Acute cystitis without hematuria  - Urinalysis, Complete   No follow-ups on file.  Martina Sinner, MD  [facility] [address] [place], [place] [address] [phone]

## 2024-01-25 ENCOUNTER — Encounter: Payer: Self-pay | Admitting: Family Medicine

## 2024-01-25 ENCOUNTER — Ambulatory Visit (INDEPENDENT_AMBULATORY_CARE_PROVIDER_SITE_OTHER): Payer: Medicare Other | Admitting: Family Medicine

## 2024-01-25 VITALS — BP 126/68 | HR 62 | Ht 66.0 in | Wt 148.0 lb

## 2024-01-25 DIAGNOSIS — E78019 Familial hypercholesterolemia, unspecified: Secondary | ICD-10-CM

## 2024-01-25 DIAGNOSIS — I10 Essential (primary) hypertension: Secondary | ICD-10-CM | POA: Diagnosis not present

## 2024-01-25 DIAGNOSIS — E7801 Familial hypercholesterolemia: Secondary | ICD-10-CM

## 2024-01-25 DIAGNOSIS — K760 Fatty (change of) liver, not elsewhere classified: Secondary | ICD-10-CM

## 2024-01-25 DIAGNOSIS — E119 Type 2 diabetes mellitus without complications: Secondary | ICD-10-CM

## 2024-01-25 DIAGNOSIS — Z7984 Long term (current) use of oral hypoglycemic drugs: Secondary | ICD-10-CM

## 2024-01-25 DIAGNOSIS — E039 Hypothyroidism, unspecified: Secondary | ICD-10-CM

## 2024-01-25 DIAGNOSIS — K219 Gastro-esophageal reflux disease without esophagitis: Secondary | ICD-10-CM | POA: Diagnosis not present

## 2024-01-25 DIAGNOSIS — K580 Irritable bowel syndrome with diarrhea: Secondary | ICD-10-CM

## 2024-01-25 MED ORDER — CHOLESTYRAMINE LIGHT 4 GM/DOSE PO POWD: ORAL | 1 refills | Status: DC

## 2024-01-25 MED ORDER — PANTOPRAZOLE SODIUM 40 MG PO TBEC: 40.0000 mg | DELAYED_RELEASE_TABLET | Freq: Every day | ORAL | 1 refills | Status: AC

## 2024-01-25 MED ORDER — GLIPIZIDE 5 MG PO TABS
5.0000 mg | ORAL_TABLET | Freq: Every day | ORAL | 1 refills | Status: DC
Start: 2024-01-25 — End: 2024-07-12

## 2024-01-25 MED ORDER — METFORMIN HCL ER 750 MG PO TB24: ORAL_TABLET | ORAL | 1 refills | Status: DC

## 2024-01-25 MED ORDER — EZETIMIBE 10 MG PO TABS: 10.0000 mg | ORAL_TABLET | Freq: Every day | ORAL | 1 refills | Status: AC

## 2024-01-25 MED ORDER — LOSARTAN POTASSIUM-HCTZ 50-12.5 MG PO TABS: 1.0000 | ORAL_TABLET | Freq: Every day | ORAL | 1 refills | Status: DC

## 2024-01-25 MED ORDER — LEVOTHYROXINE SODIUM 25 MCG PO TABS: 25.0000 ug | ORAL_TABLET | Freq: Every day | ORAL | 1 refills | Status: AC

## 2024-01-25 NOTE — Patient Instructions (Signed)

## 2024-01-25 NOTE — Progress Notes (Signed)
 Date:  01/25/2024   Name:  Rebecca Robbins   DOB:  11-Jul-1946   MRN:  [id number]   Chief Complaint: Hyperlipidemia, Hypothyroidism, Diabetes (Patient unable to take glipizide  BID (10mg ) because she gets "shaky and feels faint"), Gastroesophageal Reflux, and Hypertension  Hyperlipidemia This is a chronic problem. The current episode started more than 1 year ago. The problem is controlled. Exacerbating diseases include hypothyroidism and liver disease. She has no history of chronic renal disease or obesity. Pertinent negatives include no chest pain, focal sensory loss, focal weakness, myalgias or shortness of breath. Current antihyperlipidemic treatment includes ezetimibe . The current treatment provides moderate improvement of lipids. There are no compliance problems.  Risk factors for coronary artery disease include dyslipidemia and hypertension.  Diabetes She presents for her follow-up diabetic visit. She has type 2 diabetes mellitus. Her disease course has been stable. There are no hypoglycemic associated symptoms. Pertinent negatives for hypoglycemia include no headaches, nervousness/anxiousness or tremors. Pertinent negatives for diabetes include no blurred vision, no chest pain, no fatigue, no foot paresthesias, no foot ulcerations, no polydipsia, no polyphagia, no polyuria, no visual change, no weakness and no weight loss. There are no hypoglycemic complications. Symptoms are stable. There are no diabetic complications. There are no known risk factors for coronary artery disease. Current diabetic treatment includes oral agent (dual therapy). She is compliant with treatment most of the time. Meal planning includes avoidance of concentrated sweets and carbohydrate counting. She rarely participates in exercise. Her home blood glucose trend is fluctuating minimally. An ACE inhibitor/angiotensin II receptor blocker is being taken.  Gastroesophageal Reflux She reports no abdominal pain, no belching,  no chest pain, no coughing, no dysphagia, no heartburn, no hoarse voice, no nausea, no sore throat, no stridor or no wheezing. This is a chronic problem. The current episode started more than 1 year ago. The problem has been gradually improving. The symptoms are aggravated by certain foods. Pertinent negatives include no fatigue or weight loss. She has tried a PPI for the symptoms. The treatment provided moderate relief.  Hypertension This is a chronic problem. The current episode started more than 1 year ago. The problem has been gradually improving since onset. The problem is controlled. Pertinent negatives include no blurred vision, chest pain, headaches, orthopnea, palpitations, PND or shortness of breath. Past treatments include angiotensin blockers and diuretics. The current treatment provides moderate improvement. There are no compliance problems.  Identifiable causes of hypertension include a thyroid  problem. There is no history of chronic renal disease, a hypertension causing med or renovascular disease.  Thyroid  Problem Presents for follow-up visit. Symptoms include cold intolerance, diarrhea and nail problem. Patient reports no anxiety, constipation, depressed mood, diaphoresis, dry skin, fatigue, hair loss, heat intolerance, hoarse voice, leg swelling, menstrual problem, palpitations, tremors, visual change, weight gain or weight loss. The symptoms have been stable. Her past medical history is significant for hyperlipidemia.    Lab Results  Component Value Date   NA 137 09/25/2023   K 3.9 09/25/2023   CO2 18 (L) 09/25/2023   GLUCOSE 155 (H) 09/25/2023   BUN 12 09/25/2023   CREATININE 0.63 09/25/2023   CALCIUM  9.7 09/25/2023   EGFR 91 09/25/2023   GFRNONAA 89 12/18/2020   Lab Results  Component Value Date   CHOL 191 09/25/2023   HDL 62 09/25/2023   LDLCALC 109 (H) 09/25/2023   TRIG 113 09/25/2023   CHOLHDL 3.1 08/23/2018   Lab Results  Component Value Date   TSH 3.930  09/25/2023   Lab Results  Component Value Date   HGBA1C 7.2 (H) 09/25/2023   No results found for: "WBC", "HGB", "HCT", "MCV", "PLT" Lab Results  Component Value Date   ALT 19 09/25/2023   AST 22 09/25/2023   GGT 29 05/21/2022   ALKPHOS 89 09/25/2023   BILITOT 0.6 09/25/2023   No results found for: "25OHVITD2", "25OHVITD3", "VD25OH"   Review of Systems  Constitutional:  Negative for diaphoresis, fatigue, weight gain and weight loss.  HENT:  Negative for congestion, hoarse voice, nosebleeds, postnasal drip, rhinorrhea, sinus pressure and sore throat.   Eyes:  Negative for blurred vision and visual disturbance.  Respiratory:  Negative for cough, chest tightness, shortness of breath and wheezing.   Cardiovascular:  Negative for chest pain, palpitations, orthopnea, leg swelling and PND.  Gastrointestinal:  Positive for diarrhea. Negative for abdominal pain, blood in stool, constipation, dysphagia, heartburn and nausea.  Endocrine: Positive for cold intolerance. Negative for heat intolerance, polydipsia, polyphagia and polyuria.  Genitourinary:  Negative for menstrual problem.  Musculoskeletal:  Negative for myalgias.  Neurological:  Negative for tremors, focal weakness, weakness and headaches.  Psychiatric/Behavioral:  The patient is not nervous/anxious.     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    Allergies  Allergen Reactions   Azithromycin  Rash    Hydrocodone-Acetaminophen  Nausea Only and Rash   Penicillins Other (See Comments)    Does not work for patient    Past Surgical History:  Procedure Laterality Date   BREAST BIOPSY Right 2004   neg   CHOLECYSTECTOMY     COLONOSCOPY  2015   repeat 10 yrs- Dr Ole Berkeley   MENISCUS REPAIR Bilateral    Right x2, Left x1    Social History   Tobacco Use   Smoking status: Never    Passive exposure: Never   Smokeless tobacco: Never  Vaping Use   Vaping status: Never Used  Substance Use Topics   Alcohol use: Yes    Alcohol/week: 3.0 standard drinks of alcohol    Types: 3 Glasses of wine per week   Drug use: No     Medication list has been reviewed and updated.  Current Meds  Medication Sig   cholestyramine  light (PREVALITE ) 4 GM/DOSE powder TAKE 1 TEAPOONFUL BY MOUTH EVERY DAY AS DIRECTED   hyoscyamine  (LEVSIN SL) 0.125 MG SL tablet Place 1 tablet (0.125 mg total) under the tongue every 4 (four) hours as needed.   [DISCONTINUED] ezetimibe  (ZETIA ) 10 MG tablet Take 1 tablet (10 mg total) by mouth daily.   [DISCONTINUED] glipiZIDE  (GLUCOTROL ) 5 MG tablet Take 1 tablet (5 mg total) by mouth daily before breakfast.   [DISCONTINUED] levothyroxine  (SYNTHROID ) 25 MCG tablet Take 1 tablet (25 mcg total) by mouth daily.   [DISCONTINUED] losartan -hydrochlorothiazide (HYZAAR) 50-12.5 MG tablet Take 1 tablet by mouth daily.   [DISCONTINUED] metFORMIN  (GLUCOPHAGE -XR) 750 MG 24 hr tablet TAKE 1 TABLET BY MOUTH EVERY DAY WITH BREAKFAST   [DISCONTINUED] pantoprazole  (PROTONIX ) 40 MG tablet Take 1 tablet (40 mg total) by mouth  daily.       01/25/2024   10:35 AM 09/24/2023    1:22 PM 05/12/2023   10:13 AM 03/31/2023    9:55 AM  GAD 7 : Generalized Anxiety Score  Nervous, Anxious, on Edge 1 1 0 0  Control/stop worrying 1 1 0 0  Worry too much - different things 1 1 0 0  Trouble relaxing 1 1 0 0  Restless 1 0 0 0  Easily annoyed or irritable 0 0 0 0  Afraid - awful might happen 0 0 0 0  Total  GAD 7 Score 5 4 0 0  Anxiety Difficulty Not difficult at all Not difficult at all Not difficult at all Not difficult at all       01/25/2024   10:34 AM 09/24/2023    1:22 PM 05/12/2023   10:12 AM  Depression screen PHQ 2/9  Decreased Interest 0 0 0  Down, Depressed, Hopeless 1 0 0  PHQ - 2 Score 1 0 0  Altered sleeping 1 1 0  Tired, decreased energy 2 2 0  Change in appetite 0 0 0  Feeling bad or failure about yourself  0 0 0  Trouble concentrating 0 0 0  Moving slowly or fidgety/restless 0 0 0  Suicidal thoughts 0 0 0  PHQ-9 Score 4 3 0  Difficult doing work/chores Not difficult at all Not difficult at all Not difficult at all    BP Readings from Last 3 Encounters:  01/25/24 126/68  11/02/23 (!) 171/81  09/24/23 128/78    Physical Exam Vitals and nursing note reviewed.  Constitutional:      General: She is not in acute distress.    Appearance: She is not diaphoretic.  HENT:     Head: Normocephalic and atraumatic.     Right Ear: Tympanic membrane, ear canal and external ear normal. There is no impacted cerumen.     Left Ear: Tympanic membrane, ear canal and external ear normal. There is no impacted cerumen.     Nose: Nose normal. No congestion or rhinorrhea.     Mouth/Throat:     Mouth: Mucous membranes are moist.     Pharynx: No oropharyngeal exudate or posterior oropharyngeal erythema.  Eyes:     General:        Right eye: No discharge.        Left eye: No discharge.     Conjunctiva/sclera: Conjunctivae normal.     Pupils: Pupils are equal, round, and reactive to light.  Neck:     Thyroid : No thyromegaly.     Vascular: No carotid bruit or JVD.  Cardiovascular:     Rate and Rhythm: Normal rate and regular rhythm.     Heart sounds: Normal heart sounds. No murmur heard.    No friction rub. No gallop.  Pulmonary:     Effort: Pulmonary effort is normal. No respiratory distress.     Breath sounds: Normal breath sounds. No stridor. No wheezing, rhonchi or rales.   Chest:     Chest wall: No tenderness.  Abdominal:     General: Bowel sounds are normal. There is no distension.     Palpations: Abdomen is soft. There is no mass.     Tenderness: There is no abdominal tenderness. There is no guarding.  Musculoskeletal:     Cervical back: Normal range of motion and neck supple. No rigidity or tenderness.  Lymphadenopathy:     Cervical: No cervical adenopathy.  Skin:    General:  Skin is warm.  Neurological:     Mental Status: She is alert.     Deep Tendon Reflexes: Reflexes are normal and symmetric.     Wt Readings from Last 3 Encounters:  01/25/24 148 lb (67.1 kg)  09/24/23 151 lb (68.5 kg)  08/13/23 152 lb (68.9 kg)    BP 126/68   Pulse 62   Ht 5\' 6"  (1.676 m)   Wt 148 lb (67.1 kg)   SpO2 97%   BMI 23.89 kg/m   Assessment and Plan:  1. Steatosis of liver Chronic.  Controlled.  Stable.  Patient unable to tolerate statins and we will continue Zetia  10 mg once a day.  Relatively recent lipid panel was reviewed and will continue at current dosing. - ezetimibe  (ZETIA ) 10 MG tablet; Take 1 tablet (10 mg total) by mouth daily.  Dispense: 90 tablet; Refill: 1  2. Familial hypercholesterolemia Chronic.  Controlled.  Stable.  Continue Zetia  10 mg once a day.  Will also encourage low-cholesterol low triglyceride dietary guidelines which has been included in patient's wrap-up. - ezetimibe  (ZETIA ) 10 MG tablet; Take 1 tablet (10 mg total) by mouth daily.  Dispense: 90 tablet; Refill: 1  3. Diabetes mellitus treated with oral medication (HCC) (Primary) Chronic.  Controlled.  Stable.  Continue glipizide  5 mg once a day and metformin  XR 750 mg once a day.  Will check A1c and microalbuminuria. - glipiZIDE  (GLUCOTROL ) 5 MG tablet; Take 1 tablet (5 mg total) by mouth daily before breakfast.  Dispense: 90 tablet; Refill: 1 - metFORMIN  (GLUCOPHAGE -XR) 750 MG 24 hr tablet; TAKE 1 TABLET BY MOUTH EVERY DAY WITH BREAKFAST  Dispense: 90 tablet; Refill: 1 -  Hemoglobin A1c - Microalbumin / creatinine urine ratio  4. Adult hypothyroidism Chronic.  Controlled.  Stable.  Review of previous TSH is acceptable.  And will continue levothyroxine  25 mcg daily. - levothyroxine  (SYNTHROID ) 25 MCG tablet; Take 1 tablet (25 mcg total) by mouth daily.  Dispense: 90 tablet; Refill: 1  5. Primary hypertension Chronic.  Controlled.  Stable.  Blood pressure 126/68.  Continue losartan  hydrochlorothiazide 50-12.5 mg once a day.  Will recheck blood pressure in 6 months. - losartan -hydrochlorothiazide (HYZAAR) 50-12.5 MG tablet; Take 1 tablet by mouth daily.  Dispense: 90 tablet; Refill: 1  6. Gastroesophageal reflux disease, unspecified whether esophagitis present Chronic.  Controlled.  Stable.  Continue pantoprazole  40 mg once a day. - pantoprazole  (PROTONIX ) 40 MG tablet; Take 1 tablet (40 mg total) by mouth daily.  Dispense: 90 tablet; Refill: 1  7. Irritable bowel syndrome with diarrhea Chronic.  Controlled with cholestyramine  I will continue with current dosing of 1 teaspoon every day as directed. - cholestyramine  light (PREVALITE ) 4 GM/DOSE powder; TAKE 1 TEAPOONFUL BY MOUTH EVERY DAY AS DIRECTED  Dispense: 231 g; Refill: 1    Alayne Allis, MD

## 2024-01-26 LAB — HEMOGLOBIN A1C
Est. average glucose Bld gHb Est-mCnc: 160 mg/dL
Hgb A1c MFr Bld: 7.2 % — ABNORMAL HIGH (ref 4.8–5.6)

## 2024-01-26 LAB — MICROALBUMIN / CREATININE URINE RATIO
Creatinine, Urine: 16 mg/dL
Microalb/Creat Ratio: <19 mg/g{creat} (ref 0–29)
Microalbumin, Urine: <3 ug/mL

## 2024-01-27 ENCOUNTER — Encounter: Payer: Self-pay | Admitting: Family Medicine

## 2024-01-27 ENCOUNTER — Telehealth: Payer: Self-pay

## 2024-01-27 NOTE — Telephone Encounter (Signed)
PA completed waiting on insurance approval.  Key: [id number]  KP

## 2024-01-28 ENCOUNTER — Other Ambulatory Visit: Payer: Self-pay | Admitting: Family Medicine

## 2024-01-28 DIAGNOSIS — K580 Irritable bowel syndrome with diarrhea: Secondary | ICD-10-CM

## 2024-02-01 DIAGNOSIS — H6123 Impacted cerumen, bilateral: Secondary | ICD-10-CM | POA: Diagnosis not present

## 2024-02-01 DIAGNOSIS — H903 Sensorineural hearing loss, bilateral: Secondary | ICD-10-CM | POA: Diagnosis not present

## 2024-02-02 ENCOUNTER — Other Ambulatory Visit: Payer: Self-pay

## 2024-02-02 MED ORDER — CHOLESTYRAMINE 4 G PO PACK: 4.0000 g | PACK | Freq: Two times a day (BID) | ORAL | 0 refills | Status: AC

## 2024-02-02 NOTE — Telephone Encounter (Signed)
 Denied  KP

## 2024-03-17 ENCOUNTER — Telehealth: Payer: Self-pay | Admitting: Family Medicine

## 2024-03-17 NOTE — Telephone Encounter (Signed)
 Copied from CRM [id number]. Topic: Medicare AWV >> Mar 17, 2024  1:50 PM Payton Doughty wrote: Reason for CRM: Called LVM 03/17/2024 to schedule AWV. Please schedule Virtual or Telehealth visits ONLY.   Verlee Rossetti; Care Guide Ambulatory Clinical Support [facility] Direct Dial: [phone]

## 2024-06-16 DIAGNOSIS — I10 Essential (primary) hypertension: Secondary | ICD-10-CM | POA: Diagnosis not present

## 2024-06-16 DIAGNOSIS — E119 Type 2 diabetes mellitus without complications: Secondary | ICD-10-CM | POA: Diagnosis not present

## 2024-06-16 DIAGNOSIS — Z1331 Encounter for screening for depression: Secondary | ICD-10-CM | POA: Diagnosis not present

## 2024-06-16 DIAGNOSIS — K219 Gastro-esophageal reflux disease without esophagitis: Secondary | ICD-10-CM | POA: Diagnosis not present

## 2024-06-20 DIAGNOSIS — R399 Unspecified symptoms and signs involving the genitourinary system: Secondary | ICD-10-CM | POA: Diagnosis not present

## 2024-07-04 DIAGNOSIS — H2513 Age-related nuclear cataract, bilateral: Secondary | ICD-10-CM | POA: Diagnosis not present

## 2024-07-04 DIAGNOSIS — E119 Type 2 diabetes mellitus without complications: Secondary | ICD-10-CM | POA: Diagnosis not present

## 2024-07-12 ENCOUNTER — Ambulatory Visit (INDEPENDENT_AMBULATORY_CARE_PROVIDER_SITE_OTHER): Payer: PRIVATE HEALTH INSURANCE | Admitting: Physician Assistant

## 2024-07-12 ENCOUNTER — Encounter: Payer: Self-pay | Admitting: Physician Assistant

## 2024-07-12 VITALS — BP 159/79 | HR 68 | Ht 66.0 in | Wt 143.0 lb

## 2024-07-12 DIAGNOSIS — R309 Painful micturition, unspecified: Secondary | ICD-10-CM

## 2024-07-12 LAB — URINALYSIS, COMPLETE
Bilirubin, UA: NEGATIVE
Glucose, UA: NEGATIVE
Ketones, UA: NEGATIVE
Nitrite, UA: NEGATIVE
Protein,UA: NEGATIVE
RBC, UA: NEGATIVE
Specific Gravity, UA: <1.005 — ABNORMAL LOW (ref 1.005–1.030)
Urobilinogen, Ur: 0.2 mg/dL (ref 0.2–1.0)
pH, UA: 6 (ref 5.0–7.5)

## 2024-07-12 LAB — MICROSCOPIC EXAMINATION

## 2024-07-12 MED ORDER — DOXYCYCLINE HYCLATE 100 MG PO CAPS: 100.0000 mg | ORAL_CAPSULE | Freq: Two times a day (BID) | ORAL | 0 refills | Status: AC

## 2024-07-12 MED ORDER — FLUCONAZOLE 150 MG PO TABS: 150.0000 mg | ORAL_TABLET | Freq: Once | ORAL | 0 refills | Status: AC

## 2024-07-12 NOTE — Progress Notes (Signed)
 07/12/2024 3:52 PM   Rebecca Robbins [birth year] [id number]  CC: Chief Complaint  Patient presents with   Burning with urination   Urinary Frequency   HPI: Rebecca Robbins is a 78 y.o. female with PMH diabetes, GSM, occasional UTI, and OAB who presents today for evaluation of possible UTI.   Today she reports intermittent dysuria and urinary frequency for the past month.  She has also noticed some hand pain with urination.  She wonders about suppressive antibiotics.  In-office UA today positive for trace leukocytes; urine microscopy with 11-30 WBCs/HPF.  PMH: Past Medical History:  Diagnosis Date   Diabetes mellitus without complication (HCC)    GERD (gastroesophageal reflux disease)    Hypothyroidism    Irritable bowel syndrome    PONV (postoperative nausea and vomiting)    Thyroid  disease     Surgical History: Past Surgical History:  Procedure Laterality Date   BREAST BIOPSY Right 2004   neg   CHOLECYSTECTOMY     COLONOSCOPY  2015   repeat 10 yrs- Dr Jinny   MENISCUS REPAIR Bilateral    Right x2, Left x1    Home Medications:  Allergies as of 07/12/2024       Reactions   Azithromycin  Rash   Hydrocodone-acetaminophen  Nausea Only, Rash   Penicillins Other (See Comments)   Does not work for patient        Medication List        Accurate as of July 12, 2024  3:52 PM. If you have any questions, ask your nurse or doctor.          STOP taking these medications    glipiZIDE  5 MG tablet Commonly known as: Glucotrol  Stopped by: Konner Warrior   losartan -hydrochlorothiazide 50-12.5 MG tablet Commonly known as: HYZAAR Stopped by: Chaynce Schafer       TAKE these medications    cholestyramine  4 g packet Commonly known as: QUESTRAN  Take 1 packet (4 g total) by mouth 2 (two) times daily.   ezetimibe  10 MG tablet Commonly known as: ZETIA  Take 1 tablet (10 mg total) by mouth daily.   hyoscyamine  0.125 MG SL tablet Commonly known  as: LEVSIN  SL Place 1 tablet (0.125 mg total) under the tongue every 4 (four) hours as needed.   levothyroxine  25 MCG tablet Commonly known as: SYNTHROID  Take 1 tablet (25 mcg total) by mouth daily.   metFORMIN  750 MG 24 hr tablet Commonly known as: GLUCOPHAGE -XR TAKE 1 TABLET BY MOUTH EVERY DAY WITH BREAKFAST   pantoprazole  40 MG tablet Commonly known as: PROTONIX  Take 1 tablet (40 mg total) by mouth daily.        Allergies:  Allergies  Allergen Reactions   Azithromycin  Rash   Hydrocodone-Acetaminophen  Nausea Only and Rash   Penicillins Other (See Comments)    Does not work for patient    Family History: Family History  Problem Relation Age of Onset   Cancer Mother    Diabetes Mother    Cancer Father    Heart disease Maternal Grandmother    Heart disease Paternal Grandmother    Breast cancer Neg Hx     Social History:   reports that she has never smoked. She has never been exposed to tobacco smoke. She has never used smokeless tobacco. She reports current alcohol use of about 3.0 standard drinks of alcohol per week. She reports that she does not use drugs.  Physical Exam: BP (!) 159/79   Pulse 68   Ht 5' 6 (  1.676 m)   Wt 143 lb (64.9 kg)   BMI 23.08 kg/m   Constitutional:  Alert and oriented, no acute distress, nontoxic appearing HEENT: [place], AT Cardiovascular: No clubbing, cyanosis, or edema Respiratory: Normal respiratory effort, no increased work of breathing Skin: No rashes, bruises or suspicious lesions Neurologic: Grossly intact, no focal deficits, moving all 4 extremities Psychiatric: Normal mood and affect  Laboratory Data: Results for orders placed or performed in visit on 07/12/24  Microscopic Examination   Collection Time: 07/12/24  3:16 PM   Urine  Result Value Ref Range   WBC, UA 11-30 (A) 0 - 5 /hpf   RBC, Urine 0-2 0 - 2 /hpf   Epithelial Cells (non renal) 0-10 0 - 10 /hpf   Bacteria, UA Few None seen/Few  Urinalysis, Complete    Collection Time: 07/12/24  3:16 PM  Result Value Ref Range   Specific Gravity, UA <1.005 (L) 1.005 - 1.030   pH, UA 6.0 5.0 - 7.5   Color, UA Yellow Yellow   Appearance Ur Clear Clear   Leukocytes,UA Trace (A) Negative   Protein,UA Negative Negative/Trace   Glucose, UA Negative Negative   Ketones, UA Negative Negative   RBC, UA Negative Negative   Bilirubin, UA Negative Negative   Urobilinogen, Ur 0.2 0.2 - 1.0 mg/dL   Nitrite, UA Negative Negative   Microscopic Examination See below:    Assessment & Plan:   1. Painful urination (Primary) UA today with pyuria, will start empiric doxycycline  and send for culture for further evaluation.  Will also obtain atypical culture given more unusual presentation.  Will defer suppressive antibiotics pending culture results.  Also sending in Diflucan  per patient request. - Urinalysis, Complete - CULTURE, URINE COMPREHENSIVE - Mycoplasma / ureaplasma culture - doxycycline  (VIBRAMYCIN ) 100 MG capsule; Take 1 capsule (100 mg total) by mouth 2 (two) times daily for 7 days.  Dispense: 14 capsule; Refill: 0 - fluconazole  (DIFLUCAN ) 150 MG tablet; Take 1 tablet (150 mg total) by mouth once for 1 dose.  Dispense: 1 tablet; Refill: 0   Return for Will call with results.  Lucie Hones, PA-C  [facility] [place] [address] [place], [place] [address] [phone]

## 2024-07-16 LAB — CULTURE, URINE COMPREHENSIVE

## 2024-07-18 LAB — MYCOPLASMA / UREAPLASMA CULTURE

## 2024-07-20 ENCOUNTER — Ambulatory Visit: Payer: Self-pay | Admitting: Physician Assistant

## 2024-07-21 MED ORDER — NITROFURANTOIN MONOHYD MACRO 100 MG PO CAPS: 100.0000 mg | ORAL_CAPSULE | Freq: Every day | ORAL | 2 refills | Status: DC

## 2024-07-21 NOTE — Telephone Encounter (Signed)
 Patient aware of results and recommendations.  She would like to proceed with macrobid .  Prescription sent to pharmacy.  Follow up appointment scheduled.

## 2024-07-21 NOTE — Telephone Encounter (Signed)
-----   Message from [facility] sent at 07/20/2024  1:43 PM EDT ----- Her urine culture grew a very small volume of 2 different types of bacteria.  The doxycycline  I prescribed will treat 1 of these, but not the other, however there was barely any of this bacteria in  the sample I think it is most likely a contaminant.  I do not think we need to change her antibiotics unless she is having persistent UTI symptoms.  Okay to offer Macrobid  100 mg daily x 90 days for  UTI prevention, and would recommend follow-up with me in 3 months. ----- Message ----- From: Interface, [facility] Lab Results In Sent: 07/12/2024   4:36 PM EDT To: Samantha Vaillancourt, PA-C

## 2024-10-10 DIAGNOSIS — E039 Hypothyroidism, unspecified: Secondary | ICD-10-CM | POA: Diagnosis not present

## 2024-10-10 DIAGNOSIS — Z23 Encounter for immunization: Secondary | ICD-10-CM | POA: Diagnosis not present

## 2024-10-10 DIAGNOSIS — Z1331 Encounter for screening for depression: Secondary | ICD-10-CM | POA: Diagnosis not present

## 2024-10-10 DIAGNOSIS — Z1321 Encounter for screening for nutritional disorder: Secondary | ICD-10-CM | POA: Diagnosis not present

## 2024-10-10 DIAGNOSIS — E119 Type 2 diabetes mellitus without complications: Secondary | ICD-10-CM | POA: Diagnosis not present

## 2024-10-10 DIAGNOSIS — Z87898 Personal history of other specified conditions: Secondary | ICD-10-CM | POA: Diagnosis not present

## 2024-10-10 DIAGNOSIS — E538 Deficiency of other specified B group vitamins: Secondary | ICD-10-CM | POA: Diagnosis not present

## 2024-10-11 DIAGNOSIS — H902 Conductive hearing loss, unspecified: Secondary | ICD-10-CM | POA: Diagnosis not present

## 2024-10-11 DIAGNOSIS — H6123 Impacted cerumen, bilateral: Secondary | ICD-10-CM | POA: Diagnosis not present

## 2024-10-19 ENCOUNTER — Encounter: Payer: Self-pay | Admitting: Physician Assistant

## 2024-10-19 ENCOUNTER — Ambulatory Visit (INDEPENDENT_AMBULATORY_CARE_PROVIDER_SITE_OTHER): Payer: PRIVATE HEALTH INSURANCE | Admitting: Physician Assistant

## 2024-10-19 VITALS — BP 177/85 | HR 85 | Ht 66.0 in | Wt 143.0 lb

## 2024-10-19 DIAGNOSIS — N390 Urinary tract infection, site not specified: Secondary | ICD-10-CM | POA: Diagnosis not present

## 2024-10-19 LAB — MICROSCOPIC EXAMINATION

## 2024-10-19 MED ORDER — NITROFURANTOIN MONOHYD MACRO 100 MG PO CAPS: 100.0000 mg | ORAL_CAPSULE | Freq: Every day | ORAL | 1 refills | Status: AC

## 2024-10-19 NOTE — Progress Notes (Signed)
 10/19/2024 12:03 PM   Kialee  E Ardean [birth year] [id number]  CC: Chief Complaint  Patient presents with   Recurrent UTI   HPI: Rebecca Robbins is a 78 y.o. female with PMH diabetes, GSM, occasional UTI on suppressive Macrobid , and OAB who presents today for follow-up.   Today she reports she is doing exceptionally well on low-dose daily Macrobid .  No infections since her last visit.  She hopes to continue this.  In-office UA today positive for trace leukocytes; urine microscopy with >10 epithelial cells/hpf.  PMH: Past Medical History:  Diagnosis Date   Diabetes mellitus without complication (HCC)    GERD (gastroesophageal reflux disease)    Hypothyroidism    Irritable bowel syndrome    PONV (postoperative nausea and vomiting)    Thyroid  disease     Surgical History: Past Surgical History:  Procedure Laterality Date   BREAST BIOPSY Right 2004   neg   CHOLECYSTECTOMY     COLONOSCOPY  2015   repeat 10 yrs- Dr Jinny   MENISCUS REPAIR Bilateral    Right x2, Left x1    Home Medications:  Allergies as of 10/19/2024       Reactions   Azithromycin  Rash   Hydrocodone-acetaminophen  Nausea Only, Rash   Penicillins Other (See Comments)   Does not work for patient        Medication List        Accurate as of October 19, 2024 12:03 PM. If you have any questions, ask your nurse or doctor.          cholestyramine  4 g packet Commonly known as: QUESTRAN  Take 1 packet (4 g total) by mouth 2 (two) times daily.   cholestyramine  light 4 GM/DOSE powder Commonly known as: PREVALITE  SMARTSIG:4 Gram(s) By Mouth Every Morning   ezetimibe  10 MG tablet Commonly known as: ZETIA  Take 1 tablet (10 mg total) by mouth daily.   fluconazole  150 MG tablet Commonly known as: DIFLUCAN  Take 150 mg by mouth once.   hyoscyamine  0.125 MG SL tablet Commonly known as: LEVSIN  SL Place 1 tablet (0.125 mg total) under the tongue every 4 (four) hours as needed.   levothyroxine   25 MCG tablet Commonly known as: SYNTHROID  Take 1 tablet (25 mcg total) by mouth daily.   metFORMIN  750 MG 24 hr tablet Commonly known as: GLUCOPHAGE -XR Take 750 mg by mouth. What changed: Another medication with the same name was removed. Continue taking this medication, and follow the directions you see here. Changed by: Gideon Burstein   mirtazapine 15 MG tablet Commonly known as: REMERON Take 15 mg by mouth.   nitrofurantoin  (macrocrystal-monohydrate) 100 MG capsule Commonly known as: MACROBID  Take 1 capsule (100 mg total) by mouth daily at 6 (six) AM.   pantoprazole  40 MG tablet Commonly known as: PROTONIX  Take 1 tablet (40 mg total) by mouth daily.        Allergies:  Allergies  Allergen Reactions   Azithromycin  Rash   Hydrocodone-Acetaminophen  Nausea Only and Rash   Penicillins Other (See Comments)    Does not work for patient    Family History: Family History  Problem Relation Age of Onset   Cancer Mother    Diabetes Mother    Cancer Father    Heart disease Maternal Grandmother    Heart disease Paternal Grandmother    Breast cancer Neg Hx     Social History:   reports that she has never smoked. She has never been exposed to tobacco smoke. She has never  used smokeless tobacco. She reports current alcohol use of about 3.0 standard drinks of alcohol per week. She reports that she does not use drugs.  Physical Exam: BP (!) 177/85   Pulse 85   Ht 5' 6 (1.676 m)   Wt 143 lb (64.9 kg)   SpO2 95%   BMI 23.08 kg/m   Constitutional:  Alert and oriented, no acute distress, nontoxic appearing HEENT: [place], AT Cardiovascular: No clubbing, cyanosis, or edema Respiratory: Normal respiratory effort, no increased work of breathing Skin: No rashes, bruises or suspicious lesions Neurologic: Grossly intact, no focal deficits, moving all 4 extremities Psychiatric: Normal mood and affect  Laboratory Data: Results for orders placed or performed in visit on  10/19/24  Microscopic Examination   Collection Time: 10/19/24 11:20 AM   Urine  Result Value Ref Range   WBC, UA WILL FOLLOW    RBC, Urine WILL FOLLOW    Epithelial Cells (non renal) WILL FOLLOW    Renal Epithel, UA WILL FOLLOW    Casts WILL FOLLOW    Cast Type WILL FOLLOW    Crystals WILL FOLLOW    Crystal Type WILL FOLLOW    Mucus, UA WILL FOLLOW    Bacteria, UA WILL FOLLOW    Yeast, UA WILL FOLLOW    Trichomonas, UA WILL FOLLOW    Urinalysis Comments WILL FOLLOW   Urinalysis, Complete   Collection Time: 10/19/24 11:20 AM  Result Value Ref Range   Specific Gravity, UA <1.005 (L) 1.005 - 1.030   pH, UA 6.0 5.0 - 7.5   Color, UA Yellow Yellow   Appearance Ur Clear Clear   Leukocytes,UA Trace (A) Negative   Protein,UA Negative Negative/Trace   Glucose, UA Negative Negative   Ketones, UA Negative Negative   RBC, UA Negative Negative   Bilirubin, UA Negative Negative   Urobilinogen, Ur 0.2 0.2 - 1.0 mg/dL   Nitrite, UA Negative Negative   Microscopic Examination See below:    Assessment & Plan:   1. Recurrent UTI (Primary) UA bland, symptoms well-controlled on daily Macrobid .  Using shared decision making, we decided to continue therapy an additional 6 months.  I will see her back in about 7 months for symptom recheck off the Macrobid . - Urinalysis, Complete - nitrofurantoin , macrocrystal-monohydrate, (MACROBID ) 100 MG capsule; Take 1 capsule (100 mg total) by mouth daily at 6 (six) AM.  Dispense: 90 capsule; Refill: 1  Return in about 7 months (around 05/19/2025) for rUTI follow up.  Lucie Hones, PA-C  [facility] [place] [address], [address] [place], [place] [address] [phone]

## 2024-10-20 ENCOUNTER — Ambulatory Visit: Payer: PRIVATE HEALTH INSURANCE | Admitting: Physician Assistant

## 2024-10-20 LAB — URINALYSIS, COMPLETE
Bilirubin, UA: NEGATIVE
Glucose, UA: NEGATIVE
Ketones, UA: NEGATIVE
Nitrite, UA: NEGATIVE
Protein,UA: NEGATIVE
RBC, UA: NEGATIVE
Specific Gravity, UA: <1.005 — ABNORMAL LOW (ref 1.005–1.030)
Urobilinogen, Ur: 0.2 mg/dL (ref 0.2–1.0)
pH, UA: 6 (ref 5.0–7.5)

## 2024-10-20 LAB — MICROSCOPIC EXAMINATION: Epithelial Cells (non renal): >10 /HPF — AB (ref 0–10)

## 2025-05-22 ENCOUNTER — Ambulatory Visit: Payer: PRIVATE HEALTH INSURANCE | Admitting: Physician Assistant
